# Patient Record
Sex: Male | Born: 1970 | Race: White | Hispanic: No | Marital: Single | Smoking: Never smoker
Health system: Southern US, Community
[De-identification: ages and names within clinical notes are randomized; demographics above are authoritative.]

## PROBLEM LIST (undated history)

## (undated) DIAGNOSIS — I10 Essential (primary) hypertension: Secondary | ICD-10-CM

## (undated) DIAGNOSIS — F1010 Alcohol abuse, uncomplicated: Secondary | ICD-10-CM

## (undated) HISTORY — DX: Essential (primary) hypertension: I10

---

## 2016-03-11 ENCOUNTER — Encounter: Payer: Self-pay | Admitting: *Deleted

## 2016-03-12 ENCOUNTER — Encounter: Payer: Self-pay | Admitting: Neurology

## 2016-03-12 ENCOUNTER — Ambulatory Visit (INDEPENDENT_AMBULATORY_CARE_PROVIDER_SITE_OTHER): Payer: BLUE CROSS/BLUE SHIELD | Admitting: Neurology

## 2016-03-12 VITALS — BP 110/73 | HR 99 | Ht 69.0 in | Wt 226.4 lb

## 2016-03-12 DIAGNOSIS — I10 Essential (primary) hypertension: Secondary | ICD-10-CM | POA: Diagnosis not present

## 2016-03-12 DIAGNOSIS — G378 Other specified demyelinating diseases of central nervous system: Secondary | ICD-10-CM | POA: Diagnosis not present

## 2016-03-12 DIAGNOSIS — G610 Guillain-Barre syndrome: Secondary | ICD-10-CM | POA: Insufficient documentation

## 2016-03-12 MED ORDER — GABAPENTIN 300 MG PO CAPS: 300.0000 mg | ORAL_CAPSULE | Freq: Three times a day (TID) | ORAL | 2 refills | Status: AC

## 2016-03-12 NOTE — Progress Notes (Signed)
NEUROLOGY CLINIC NEW PATIENT NOTE  NAME: Ryan [name] DOB: 09/02/1970 REFERRING PHYSICIAN: Chinita GreenlandKrome, Jonathan, MD  I saw Ryan FontJoseph Kilman as a new consult in the neurovascular clinic today regarding  Chief Complaint  Patient presents with  . Weakness of BLE    rm 2, New Pt, "weaknees of upper legs x 1 month; hands numb, painful"  .  HPI: Ryan FontJoseph Lefevre is a 45 y.o. male with PMH of HTN who presents as a new patient for Bilateral hands in the leg weakness..   [place] moved [place] to [place] in 09/2015. From 11/2015, patient started to have nausea and vomiting, mild stomach pain. Symptoms getting worse over time and eventually not able to get anything down with dehydration, went to ED on 02/16/2016, had EGD done showing gastritis. He was sent home. His gastritis symptoms gradually getting better, currently on Prilosec and he was able to eat and drink, symptoms improved.   However, on 02/19/2016, he felt his leg became stiff, not walking very well, but he did not go to hospital. At the time bilateral upper extremities were normal. Second day, on 02/20/2016, he woke up with bilateral leg weakness, not able to pick up legs, really difficult walking. He felt that he still has strength on his knee and his feet, but not strong on his quads. He went to PCP and concering for GBS and sent to ER again. However, he was told it was not GBS and sent home. The symptoms continues until now. Over time he also developed bilateral thigh pain. Denies much numbness or tingling at the lower extremities.  One week after the leg weakness, around 02/25/16, he also developed bilateral hand weakness, but still able to grasp stuff but not able to write. However, most prominent was the painful sensation and numbness tingling of both hands. Symptoms continued until now. He denies any speech difficulty, swallowing difficulty, double vision, eye lid droop or loss of consciousness.  He has history of hypertension, on  lisinopril. Today blood pressure in clinic 110/73. He denies any history of diabetes, hyperlipidemia. He denies smoking or illicit drugs. However, he is a heavy drinker about 6-8 pack of beer per day for about 3 years.   Past Medical History:  Diagnosis Date  . Hypertension    History reviewed. No pertinent surgical history. Family History  Problem Relation Age of Onset  . Heart disease Mother   . Heart disease Father   . Hypertension Brother    Current Outpatient Prescriptions  Medication Sig Dispense Refill  . ALPRAZolam (XANAX) 0.5 MG tablet Take 0.5 mg by mouth 3 (three) times daily.    Marland Kitchen. gabapentin (NEURONTIN) 300 MG capsule Take 1 capsule (300 mg total) by mouth 3 (three) times daily. 90 capsule 2  . lisinopril (PRINIVIL,ZESTRIL) 40 MG tablet Take 40 mg by mouth daily.    Marland Kitchen. omeprazole (PRILOSEC) 40 MG capsule Take 40 mg by mouth daily.    . traMADol (ULTRAM) 50 MG tablet Take by mouth every 6 (six) hours as needed. Take 1-2 every 4-6 hrs     No current facility-administered medications for this visit.    No Known Allergies Social History   Social History  . Marital status: Single    Spouse name: N/A  . Number of children: N/A  . Years of education: N/A   Occupational History  .      [employer]   Social History Main Topics  . Smoking status: Never Smoker  . Smokeless tobacco: Never  Used  . Alcohol use Yes     Comment: 03/12/16 moderate- 6 pk beer daily  . Drug use: No  . Sexual activity: Not on file   Other Topics Concern  . Not on file   Social History Narrative  . No narrative on file    Review of Systems Full 14 system review of systems performed and notable only for those listed, all others are neg:  Constitutional:   Cardiovascular:  Ear/Nose/Throat:   Skin:  Eyes:   Respiratory:   Gastroitestinal:   Genitourinary:  Hematology/Lymphatic:   Endocrine: Increased thirst Musculoskeletal:  Aching muscles Allergy/Immunology:   Neurological:  Numbness,  weakness Psychiatric:  Sleep:    Physical Exam  Vitals:   03/12/16 1101  BP: 110/73  Pulse: 99    General - Well nourished, well developed, in no apparent distress.  Ophthalmologic - Sharp disc margins OU.  Cardiovascular - Regular rate and rhythm with no murmur. Carotid pulses were 2+ without bruits.   Neck - supple, no nuchal rigidity  Mental Status -  Level of arousal and orientation to time, place, and person were intact. Language including expression, naming, repetition, comprehension, reading, and writing was assessed and found intact. Fund of Knowledge was assessed and was intact.  Cranial Nerves II - XII - II - Visual field intact OU. III, IV, VI - Extraocular movements intact. V - Facial sensation intact bilaterally. VII - Facial movement intact bilaterally. VIII - Hearing & vestibular intact bilaterally. X - Palate elevates symmetrically. XI - Chin turning & shoulder shrug intact bilaterally. XII - Tongue protrusion intact.  Motor Strength - The patient's strength exam showed bilateral UE proximal 5/5, wrist extension and flexion 4/5, bilateral finger gripping 3/5, bilateral LE iliopsoas 3/5, knee extension 4/5, DF 4/5 and PF 5/5.  Bulk was normal and fasciculations were absent.   Motor Tone - Muscle tone was assessed at the neck and appendages and was normal to decreased.  Reflexes - The patient's reflexes were 2+ bilateral biceps, 1+ bilateral triceps, diminished bilateral brachioradialis reflex and diminished LE DTRs and he had no pathological reflexes.  Sensory - Light touch, temperature/pinprick and vibration decreased bilateral hands from wrist down and b/l LE from knee down. Proprioception preserved bilaterally. Painful sensation at b/l hands, L>R and bilateral thighs. Romberg testing were not tested due to safety concerns.    Coordination - The patient had normal movements in the hands with no ataxia or dysmetria.  Tremor was absent.  Gait and Station -  difficulty with walking due to proximal weakness, unsteady gait, needs wheelchair for transportation.   Imaging none  Lab Review none    Assessment and Plan:   In summary, Keghan Mcfarren is a 45 y.o. male with PMH of HTN presents with bilateral hand and leg weakness for 3 weeks, acute onset, non-progressive. Prior to this, pt had 3 months of N/V and diagnosed with gastritis which has improved. The weakness more distal at UEs, but proximal at LEs, associated with numbness, pain, and decreased vibration with preserved proprioception. Reflex decreased at distal UEs and entire LEs. No pathological reflexes. 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 ALS, MMN or myositis not likely. Pt heavy drinker but course not consistent with alcohol neuropathy. Pt is on gabapentin, will increase the dose. Will need PT/OT and EMG/NCS. Will refer to Dr. Terrace Arabia for second opinion and decide further blood tests including heavy metal screen, TSH, anti GM1 or anti GQ1b.   - increase  gabapentin to 300mg  three times a day for neuropathic pain - still can use tramadol as needed for pain - will do EMG/NCS  - will refer to PT/OT in [place] - recommended walk with walker and avoid fall - follow up in 4 weeks with either Dr. Terrace Arabia for second opinion  Thank you very much for the opportunity to participate in the care of this patient.  Please do not hesitate to call if any questions or concerns arise.  Orders Placed This Encounter  Procedures  . Ambulatory referral to Physical Therapy    Referral Priority:   Routine    Referral Type:   Physical Medicine    Referral Reason:   Specialty Services Required    Requested Specialty:   Physical Therapy    Number of Visits Requested:   1  . Ambulatory referral to Occupational Therapy    Referral Priority:   Routine    Referral Type:   Occupational Therapy    Referral Reason:   Specialty Services Required    Requested Specialty:   Occupational Therapy     Number of Visits Requested:   1  . NCV with EMG(electromyography)    Standing Status:   Future    Standing Expiration Date:   03/12/2017    Meds ordered this encounter  Medications  . DISCONTD: gabapentin (NEURONTIN) 100 MG capsule    Sig: Take 100 mg by mouth 3 (three) times daily.    Refill:  0  . lisinopril (PRINIVIL,ZESTRIL) 40 MG tablet    Sig: Take 40 mg by mouth daily.  . traMADol (ULTRAM) 50 MG tablet    Sig: Take by mouth every 6 (six) hours as needed. Take 1-2 every 4-6 hrs  . ALPRAZolam (XANAX) 0.5 MG tablet    Sig: Take 0.5 mg by mouth 3 (three) times daily.  Marland Kitchen omeprazole (PRILOSEC) 40 MG capsule    Sig: Take 40 mg by mouth daily.  Marland Kitchen gabapentin (NEURONTIN) 300 MG capsule    Sig: Take 1 capsule (300 mg total) by mouth 3 (three) times daily.    Dispense:  90 capsule    Refill:  2    Patient Instructions  - increase gabapentin to 300mg  three times a day for neuropathic pain - still can use tramadol as needed for pain - will do EMG/NCS  - will refer to PT/OT in [place] - recommended walk with walker and avoid fall - follow up in 4 weeks with either Dr. Terrace Arabia or Dr. Lenard Galloway, MD PhD [facility] [address] [place], [place] [address] [phone]

## 2016-03-12 NOTE — Patient Instructions (Addendum)
-   increase gabapentin to 300mg  three times a day for neuropathic pain - still can use tramadol as needed for pain - will do EMG/NCS  - will refer to PT/OT in [place] - recommended walk with walker and avoid fall - follow up in 4 weeks with either Dr. Terrace ArabiaYan for second opinion

## 2016-03-15 ENCOUNTER — Telehealth: Payer: Self-pay | Admitting: Neurology

## 2016-03-15 ENCOUNTER — Telehealth: Payer: Self-pay | Admitting: *Deleted

## 2016-03-15 NOTE — Telephone Encounter (Signed)
-----   Message from Marvel PlanJindong Xu, MD sent at 03/12/2016 12:32 PM EDT ----- Regarding: RE: Needs FU I wrote in the note and I would like him to see either Dr. Lucia GaskinsAhern or Dr. Terrace ArabiaYan in 4 weeks whoever has slot. Thanks  Marvel PlanJindong Xu, MD PhD Stroke Neurology 03/12/2016 12:33 PM  ----- Message ----- From: [place] PinkMary C Andreina Outten, RN Sent: 03/12/2016  12:24 PM To: Marvel PlanJindong Xu, MD, Hildred AlaminKatrina Y Murrell, RN Subject: Needs FU                                       Katrina,  I took this patient to check out today. Dr Roda ShuttersXu wanted to see him in 4 weeks  following EMG/NCS on 04/01/16.  Dr Roda ShuttersXu had no availability until mid-late Nov. So Albin FellingCarla did not schedule his follow up.  Please call the patient when you can determine his follow up.  Thank you, Ambrose PancoastMary Clare

## 2016-03-15 NOTE — Telephone Encounter (Signed)
His NCV/EMG and follow up have been rescheduled w/ Dr. Terrace ArabiaYan.  Patient aware of times.

## 2016-03-15 NOTE — Telephone Encounter (Addendum)
Left message for a return call.  He needs to be schedule with Dr. Terrace ArabiaYan in four weeks.

## 2016-03-15 NOTE — Telephone Encounter (Signed)
Sandy with [facility] is calling about a referral you sent for the patient. Andrey CampanileSandy states they only do PT not OT. Please call if there are questions.

## 2016-03-15 NOTE — Telephone Encounter (Signed)
Routed to ClayMichelle, [place] for Dr Terrace ArabiaYan. She stated she will call patient and schedule his follow up.

## 2016-03-16 NOTE — Telephone Encounter (Signed)
Sent Records to [facility] in [place] . They  handle OT and PT.  Telephone [phone]. Fax [phone].

## 2016-04-01 ENCOUNTER — Ambulatory Visit (INDEPENDENT_AMBULATORY_CARE_PROVIDER_SITE_OTHER): Payer: BLUE CROSS/BLUE SHIELD | Admitting: Neurology

## 2016-04-01 ENCOUNTER — Other Ambulatory Visit: Payer: Self-pay | Admitting: *Deleted

## 2016-04-01 DIAGNOSIS — R269 Unspecified abnormalities of gait and mobility: Secondary | ICD-10-CM | POA: Diagnosis not present

## 2016-04-01 DIAGNOSIS — R531 Weakness: Secondary | ICD-10-CM

## 2016-04-01 DIAGNOSIS — G378 Other specified demyelinating diseases of central nervous system: Secondary | ICD-10-CM

## 2016-04-01 DIAGNOSIS — G610 Guillain-Barre syndrome: Secondary | ICD-10-CM

## 2016-04-01 DIAGNOSIS — R202 Paresthesia of skin: Secondary | ICD-10-CM

## 2016-04-01 MED ORDER — TRAMADOL HCL 50 MG PO TABS: 50.0000 mg | ORAL_TABLET | Freq: Four times a day (QID) | ORAL | 0 refills | Status: AC | PRN

## 2016-04-01 NOTE — Progress Notes (Signed)
PATIENT: Ryan [name] DOB: 21-Jan-1971  HISTORICAL  Athan Casalino is a 45 year old right-handed male, seen in refer by my collegue Dr. Erlinda Hong for evaluation of subacute onset progressive bilateral lower extremity, upper extremity sensory loss and weakness. Initial evaluation was April 01 2016, I met him during electrodiagnostic study.  Reviewing Dr.Xu's note, patient recently moved from [place] to [place] in April 2017, since June 2017, he suffered significant upper GI symptoms, nausea or vomiting could not even hold liquids down, EGD in September showed gastritis, his GI symptoms gradually resolved.  But since middle of September 2017, while his GI symptoms began to improve, he noticed subacute onset bilateral lower extremity stiffness, woke up second day February 20 2016 noticed bilateral lower extremity weakness, but he denies significant lower extremity paresthesia.  A week later,he also noticed bilateral hands, upper extremity weakness, painful numbness of both hands,  Over the past one month, he has to rely on cane, had developed significant gait abnormality, lost use of both hands, difficulty buttoning, tie his shoes, over the past few days, he seems to have mild improvement, but remained symptomatic.  He denies significant low back pain neck pain, no bowel and bladder incontinence, no bulbar symptoms.  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  I also reviewed medical records he brought from previous evaluation, he had 2 years history of left hip pain, was treated by orthopedic surgeon, x-ray of left hip in January 2016 showed evidence of tendinopathy, tearing at hamstring tendons. He continue has mild left hip pain, he also has past medical history of hypertension,  He is here for electrodiagnostic study today, which showed absent sensory  responses on bilateral upper and lower extremity muscles, motor responses showed severely decreased to C map amplitude at bilateral peroneal to EDB motor responses, with slow conduction velocity in 30 m/s, absent F-wave latency. Bilateral tibial motor responses showed upper limit of distal latency, with normal C map amplitude, slow conduction velocity at 30 m/s, with prolonged F wave latency. Bilateral median motor responses showed moderately prolonged distal latency, with normal range C map amplitude, slow conduction velocity 40 m/s, prolonged F-wave latency. Bilateral ulnar motor responses also showed normal distal latency, normal C map amplitude, low normal range conduction velocity.  Electromyography showed widespread moderate to severe spontaneous activity, simple morphology motor unit potential with mildly decreased recruitment patterns.  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  He has tried gabapentin 300 mg 3 times a day without helping his neuropathic pain, add on tramadol 50 mg as needed   REVIEW OF SYSTEMS: Full 14 system review of systems performed and notable only for as above  ALLERGIES: No Known Allergies  HOME MEDICATIONS: Current Outpatient Prescriptions  Medication Sig Dispense Refill  . ALPRAZolam (XANAX) 0.5 MG tablet Take 0.5 mg by mouth 3 (three) times daily.    Marland Kitchen gabapentin (NEURONTIN) 300 MG capsule Take 1 capsule (300 mg total) by mouth 3 (  three)  times daily. 90 capsule 2  . lisinopril (PRINIVIL,ZESTRIL) 40 MG tablet Take 40 mg by mouth daily.    Marland Kitchen omeprazole (PRILOSEC) 40 MG capsule Take 40 mg by mouth daily.    . traMADol (ULTRAM) 50 MG tablet Take by mouth every 6 (six) hours as needed. Take 1-2 every 4-6 hrs     No current facility-administered medications for this visit.     PAST MEDICAL HISTORY: Past Medical History:  Diagnosis Date  . Hypertension     PAST SURGICAL HISTORY: No past surgical history on file.  FAMILY HISTORY: Family History  Problem Relation Age of Onset  . Heart disease Mother   . Heart disease Father   . Hypertension Brother     SOCIAL HISTORY:  Social History   Social History  . Marital status: Single    Spouse name: N/A  . Number of children: N/A  . Years of education: N/A   Occupational History  .      [employer]   Social History Main Topics  . Smoking status: Never Smoker  . Smokeless tobacco: Never Used  . Alcohol use Yes     Comment: 03/12/16 moderate- 6 pk beer daily  . Drug use: No  . Sexual activity: Not on file   Other Topics Concern  . Not on file   Social History Narrative  . No narrative on file     PHYSICAL EXAM   There were no vitals filed for this visit.  Not recorded      There is no height or weight on file to calculate BMI.  PHYSICAL EXAMNIATION:  Gen: NAD, conversant, well nourised, obese, well groomed                     Cardiovascular: Regular rate rhythm, no peripheral edema, warm, nontender. Eyes: Conjunctivae clear without exudates or hemorrhage Neck: Supple, no carotid bruits. Pulmonary: Clear to auscultation bilaterally   NEUROLOGICAL EXAM:  MENTAL STATUS: Speech:    Speech is normal; fluent and spontaneous with normal comprehension.  Cognition:     Orientation to time, place and person     Normal recent and remote memory     Normal Attention span and concentration     Normal Language, naming, repeating,spontaneous speech     Fund of  knowledge   CRANIAL NERVES: CN II: Visual fields are full to confrontation. Fundoscopic exam is normal with sharp discs and no vascular changes. Pupils are round equal and briskly reactive to light. CN III, IV, VI: extraocular movement are normal. No ptosis. CN V: Facial sensation is intact to pinprick in all 3 divisions bilaterally. Corneal responses are intact.  CN VII: Face is symmetric with normal eye closure and smile. CN VIII: Hearing is normal to rubbing fingers CN IX, X: Palate elevates symmetrically. Phonation is normal. CN XI: Head turning and shoulder shrug are intact CN XII: Tongue is midline with normal movements and no atrophy.  MOTOR: Bilateral upper extremity proximal muscle strength is normal, moderate weakness at bilateral wrist flexion extension grade, which is also limited by his neuropathic pain, he has mild to moderate bilateral lower extremity proximal and distal muscle weakness.  REFLEXES: Reflexes are 2+ and symmetric at the biceps, triceps, absent at knees, and ankles. Plantar responses are flexor.  SENSORY: Decreased light touch pinprick bilateral lower extremity to knee level, at bilateral upper extremity to wrist level,  COORDINATION: Rapid alternating movements and fine finger movements are intact. There is no  dysmetria on finger-to-nose and heel-knee-shin.    GAIT/STANCE: Need assistant to get out from seated position, lordotic gait, unsteady,  DIAGNOSTIC DATA (LABS, IMAGING, TESTING) - I reviewed patient records, labs, notes, testing and imaging myself where available.   ASSESSMENT AND PLAN  Vasco Chong is a 45 y.o. 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 Ph.D.  [facility] [address] [place], [place] [address] Ph: [phone] Fax: [phone]  CC: Referring Provider

## 2016-04-02 NOTE — Procedures (Addendum)
NCS (NERVE CONDUCTION STUDY) WITH EMG (ELECTROMYOGRAPHY) REPORT   STUDY DATE: April 01 2016 PATIENT NAME: Ryan [name] DOB: [birth year] MRN: [id number]    TECHNOLOGIST: Charlesetta Ivory ELECTROMYOGRAPHER: Levert Feinstein M.D.  CLINICAL INFORMATION:   45 year old male presenting with subacute onset bilateral lower extremity and then progressive to paresthesia or weakness, significant gait abnormality.  FINDINGS: NERVE CONDUCTION STUDY: Bilateral sural, superficial peroneal, medial, ulnar, right radial sensory responses were absent. Only left radial sensory was present with mildly prolonged peak latency, and decreased snap amplitude.  Bilateral peroneal to EDB motor responses showed severely decreased to C map amplitude, conduction velocity was in the 30 m/second range. F wave latency was absent.   Bilateral tibial motor responses showed upper limit normal, to mildly prolonged distal latency, low normal range C map amplitude, prolonged F wave latency, conduction velocity was in the 30 m/s range.   Bilateral median motor responses showed moderately prolonged distal latency, low normal range C map amplitude, decreased conduction velocity 40 m/s range, with prolonged F wave latency 34-35 ms.   Bilateral ulnar motor response is social showed normal distal latency, C map amplitude, mildly decreased conduction velocity 45 m/s.  NEEDLE ELECTROMYOGRAPHY: Selective needle examinations were performed at bilateral lower extremity muscles, left upper extremity muscles, bilateral lumbar sacral paraspinal muscles, and left cervical paraspinal muscles.   Bilateral tibialis anterior, tibialis posterior, medial gastrocnemius: Increased insertional activity, 2-3 plus spontaneous activity, enlarged complex motor unit potential with decreased recruitment patterns.  Bilateral vastus lateralis: Increased insertional activityspontaneous activity, enlarged complex motor unit potential with mildly decreased  recruitment patterns.  Left first dorsal interossei: Increased insertional activity 1 plus spontaneous activity, enlarge complex motor unit potential with decreased recruitment patterns.  Left pronator teres, extensor digitorum communis, biceps triceps, deltoid, no more insertion activity no spontaneous activity, mildly enlarged complex motor unit potential was mildly decreased recruitment patterns.  There was no spontaneous activity at bilateral lumbar sacral paraspinal muscles, bilateral L4, 5 S1.  There was no spontaneous activity at left cervical paraspinal muscles, left C5-6 and 7.   IMPRESSION:   This is an abnormal study. There is electrodiagnostic evidence of widespread active neuropathic changes involving bilateral lumbar sacral myotomes, and milder degree left cervical myotomes. There was evidence of prolonged distal latency, F wave latency, mildly slow conduction velocity of multiple motor nerves tested, there is no evidence of temporal dispersion.   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  Extensive evaluation including imaging study, CSF study, laboratory evaluation has initiated.   SNC    Nerve / Sites Rec. Site Peak Lat Ref. Amp.1-2 Ref. Distance    ms ms V V cm  R Radial - Anatomical snuff box (Forearm)     Forearm Wrist NR ?2.90 NR ?15.0 10  L Radial - Anatomical snuff box (Forearm)     Forearm Wrist 3.13 ?2.90 6.1 ?15.0 10  L Sural - Ankle (Calf)     Calf Ankle NR ?4.40 NR ?6.0 14  R Sural - Ankle (Calf)     Calf Ankle NR ?4.40 NR ?6.0 14  L Superficial peroneal - Ankle     Lat leg Ankle NR ?4.40 NR ?6.0 14  R Superficial peroneal - Ankle     Lat leg Ankle NR ?4.40 NR ?6.0 14  R Median - Orthodromic (Dig II, Mid palm)     Dig II Wrist NR ?3.40 NR ?10.0 13  L Median - Orthodromic (Dig II, Mid palm)  Dig II Wrist NR ?3.40 NR ?10.0 13  R Ulnar - Orthodromic, (Dig V,  Mid palm)     Dig V Wrist 3.18 ?3.10 3.2 ?5.0 11  L Ulnar - Orthodromic, (Dig V, Mid palm)     Dig V Wrist NR ?3.10 NR ?5.0 11     MNC    Nerve / Sites Muscle Latency Ref. Amplitude Ref. Rel Amp Segments Distance Lat Diff Velocity Ref. Area    ms ms mV mV %  cm ms m/s m/s mVms  R Median - APB     Wrist APB 4.8 ?4.4 3.8 ?4.0 100 Wrist - APB 7    16.0     Upper arm APB 11.8  2.6  69.3 Upper arm - Wrist 28 6.9 40  11.5  L Median - APB     Wrist APB 4.9 ?4.4 5.2 ?4.0 100 Wrist - APB 7    19.8     Upper arm APB 11.5  3.5  66.6 Upper arm - Wrist 25 6.5 38  14.3  R Ulnar - ADM     Wrist ADM 3.2 ?3.3 8.7 ?6.0 100 Wrist - ADM 7    33.6     B.Elbow ADM 7.3  7.9  91.5 B.Elbow - Wrist 19 4.2 46 ?49 33.7     A.Elbow ADM 9.3  7.6  96.2 A.Elbow - B.Elbow 9 2.0 45 ?49 33.6         A.Elbow - Wrist  6.1     L Ulnar - ADM     Wrist ADM 2.9 ?3.3 6.1 ?6.0 100 Wrist - ADM 7    26.8     B.Elbow ADM 7.2  5.9  96.7 B.Elbow - Wrist 19 4.3 44 ?49 26.1     A.Elbow ADM 9.4  5.7  96.7 A.Elbow - B.Elbow 10 2.2 46 ?49 25.6         A.Elbow - Wrist  6.5     R Peroneal - EDB     Ankle EDB 6.0 ?6.5 0.8 ?2.0 100 Ankle - EDB 9    2.5     Fib head EDB 15.8  0.7  88.8 Fib head - Ankle 31 9.8 32 ?44 2.8     Pop fossa EDB 18.9  0.7  94.9 Pop fossa - Fib head 9 3.0 30 ?44 4.4         Pop fossa - Ankle  12.8     L Peroneal - EDB     Ankle EDB 6.0 ?6.5 0.5 ?2.0 100 Ankle - EDB 9    1.6     Fib head EDB 15.8  0.5  89.6 Fib head - Ankle 29 9.8 29 ?44 1.6     Pop fossa EDB 18.5  1.1  217 Pop fossa - Fib head 8 2.7 30 ?44 2.4         Pop fossa - Ankle  12.6     R Tibial - AH     Ankle AH 5.4 ?5.8 3.9 ?4.0 100 Ankle - AH 9    13.1     Pop fossa AH 18.6  2.4  61.7 Pop fossa - Ankle 38 13.3 29 ?41 9.8  L Tibial - AH     Ankle AH 5.8 ?5.8 4.4 ?4.0 100 Ankle - AH 9    13.2     Pop fossa AH 18.6     Pop fossa - Ankle 36 12.9 28 ?41      F  Wave    Nerve F Lat Ref.   ms ms  R Tibial - AH 58.8 ?56.0  L Tibial - AH 64.8 ?56.0    R Median - APB 34.1 ?31.0  L Median - APB 34.9 ?31.0     EMG full       EMG       EMG Summary Table    Spontaneous MUAP Recruitment  Muscle IA Fib PSW Fasc Other Amp Dur. Poly Pattern  R. Tibialis anterior Increased 3+ None None _______ Increased Increased 1+ Reduced  R. Peroneus longus Increased 2+ None None _______ Increased Increased 1+ Reduced  R. Vastus lateralis Increased None None None _______ Increased Increased 1+ Reduced  R. Lumbar paraspinals (low) Normal None None None _______ Normal Normal Normal Normal  R. Lumbar paraspinals (mid) Normal None None None _______ Normal Normal Normal Normal  L. Tibialis anterior Increased 2+ None None _______ Increased Increased 1+ Reduced  L. Gastrocnemius (Medial head) Increased 2+ None None _______ Increased Increased 1+ Reduced  L. Vastus lateralis Normal None None None _______ Increased Increased 1+ Reduced  L. Lumbar paraspinals (mid) Normal None None None _______ Normal Normal Normal Normal  L. Lumbar paraspinals (low) Normal None None None _______ Normal Normal Normal Normal  L. Deltoid Normal None None None _______ Increased Increased 1+ Reduced  L. Biceps brachii Normal None None None _______ Increased Increased 1+ Reduced  L. Extensor digitorum communis Normal None None None _______ Normal Normal Normal Normal  L. Cervical paraspinals Normal None None None _______ Normal Normal Normal Normal                                                      INTERPRETING PHYSICIAN:   Levert FeinsteinYan, Denea Cheaney M.D. Ph.D. [facility] [address], [address] [place], [place] [address] [phone]

## 2016-04-02 NOTE — Addendum Note (Signed)
Addended by: Levert FeinsteinYAN, Dorenda Pfannenstiel on: 04/02/2016 09:05 AM   Modules accepted: Orders

## 2016-04-05 ENCOUNTER — Ambulatory Visit (INDEPENDENT_AMBULATORY_CARE_PROVIDER_SITE_OTHER): Payer: Self-pay

## 2016-04-05 ENCOUNTER — Telehealth: Payer: Self-pay | Admitting: Neurology

## 2016-04-05 ENCOUNTER — Inpatient Hospital Stay
Admission: RE | Admit: 2016-04-05 | Discharge: 2016-04-05 | Disposition: A | Discharge status: Home / Self Care | Payer: Self-pay | Source: Ambulatory Visit | Attending: Neurology | Admitting: Neurology

## 2016-04-05 DIAGNOSIS — R531 Weakness: Secondary | ICD-10-CM

## 2016-04-05 DIAGNOSIS — G610 Guillain-Barre syndrome: Secondary | ICD-10-CM

## 2016-04-05 DIAGNOSIS — R269 Unspecified abnormalities of gait and mobility: Secondary | ICD-10-CM

## 2016-04-05 DIAGNOSIS — Z0289 Encounter for other administrative examinations: Secondary | ICD-10-CM

## 2016-04-05 LAB — THYROID PANEL WITH TSH
FREE THYROXINE INDEX: 3.1 (ref 1.2–4.9)
T3 UPTAKE RATIO: 29 % (ref 24–39)
T4, Total: 10.7 ug/dL (ref 4.5–12.0)
TSH: 3.81 u[IU]/mL (ref 0.450–4.500)

## 2016-04-05 LAB — HEPATITIS PANEL, ACUTE
HEP A IGM: NEGATIVE
HEP B C IGM: NEGATIVE
HEP B S AG: NEGATIVE
Hep C Virus Ab: <0.1 s/co ratio (ref 0.0–0.9)

## 2016-04-05 LAB — CBC WITH DIFFERENTIAL/PLATELET
BASOS ABS: 0 10*3/uL (ref 0.0–0.2)
Basos: 0 %
EOS (ABSOLUTE): 0.1 10*3/uL (ref 0.0–0.4)
Eos: 1 %
Hematocrit: 30 % — ABNORMAL LOW (ref 37.5–51.0)
Hemoglobin: 10.9 g/dL — ABNORMAL LOW (ref 12.6–17.7)
Immature Grans (Abs): 0 10*3/uL (ref 0.0–0.1)
Immature Granulocytes: 0 %
LYMPHS ABS: 1.4 10*3/uL (ref 0.7–3.1)
Lymphs: 17 %
MCH: 34.5 pg — ABNORMAL HIGH (ref 26.6–33.0)
MCHC: 36.3 g/dL — AB (ref 31.5–35.7)
MCV: 95 fL (ref 79–97)
MONOS ABS: 1 10*3/uL — AB (ref 0.1–0.9)
Monocytes: 12 %
Neutrophils Absolute: 5.8 10*3/uL (ref 1.4–7.0)
Neutrophils: 70 %
Platelets: 207 10*3/uL (ref 150–379)
RBC: 3.16 x10E6/uL — AB (ref 4.14–5.80)
RDW: 15.8 % — AB (ref 12.3–15.4)
WBC: 8.4 10*3/uL (ref 3.4–10.8)

## 2016-04-05 LAB — B. BURGDORFI ANTIBODIES

## 2016-04-05 LAB — MULTIPLE MYELOMA PANEL, SERUM
ALBUMIN/GLOB SERPL: 1.2 (ref 0.7–1.7)
ALPHA2 GLOB SERPL ELPH-MCNC: 0.5 g/dL (ref 0.4–1.0)
Albumin SerPl Elph-Mcnc: 3.3 g/dL (ref 2.9–4.4)
Alpha 1: 0.3 g/dL (ref 0.0–0.4)
B-GLOBULIN SERPL ELPH-MCNC: 0.9 g/dL (ref 0.7–1.3)
GLOBULIN, TOTAL: 2.8 g/dL (ref 2.2–3.9)
Gamma Glob SerPl Elph-Mcnc: 1.2 g/dL (ref 0.4–1.8)
IGA/IMMUNOGLOBULIN A, SERUM: 254 mg/dL (ref 90–386)
IgG (Immunoglobin G), Serum: 1018 mg/dL (ref 700–1600)
IgM (Immunoglobulin M), Srm: 138 mg/dL (ref 20–172)

## 2016-04-05 LAB — COMPREHENSIVE METABOLIC PANEL
ALK PHOS: 106 IU/L (ref 39–117)
ALT: 75 IU/L — AB (ref 0–44)
AST: 176 IU/L — ABNORMAL HIGH (ref 0–40)
Albumin/Globulin Ratio: 1.5 (ref 1.2–2.2)
Albumin: 3.7 g/dL (ref 3.5–5.5)
BILIRUBIN TOTAL: 4.3 mg/dL — AB (ref 0.0–1.2)
BUN / CREAT RATIO: 9 (ref 9–20)
BUN: 7 mg/dL (ref 6–24)
CHLORIDE: 78 mmol/L — AB (ref 96–106)
CO2: 29 mmol/L (ref 18–29)
Calcium: 8.9 mg/dL (ref 8.7–10.2)
Creatinine, Ser: 0.82 mg/dL (ref 0.76–1.27)
GFR calc Af Amer: 123 mL/min/{1.73_m2} (ref >59)
GFR calc non Af Amer: 106 mL/min/{1.73_m2} (ref >59)
GLUCOSE: 127 mg/dL — AB (ref 65–99)
Globulin, Total: 2.4 g/dL (ref 1.5–4.5)
Potassium: 4 mmol/L (ref 3.5–5.2)
Sodium: 123 mmol/L — ABNORMAL LOW (ref 134–144)
Total Protein: 6.1 g/dL (ref 6.0–8.5)

## 2016-04-05 LAB — FOLATE: Folate: <2 ng/mL — ABNORMAL LOW (ref >3.0)

## 2016-04-05 LAB — FERRITIN: FERRITIN: 2860 ng/mL — AB (ref 30–400)

## 2016-04-05 LAB — IRON AND TIBC
Iron: 159 ug/dL (ref 38–169)
Total Iron Binding Capacity: <176 ug/dL — ABNORMAL LOW (ref 250–450)
UIBC: <17 ug/dL — ABNORMAL LOW (ref 111–343)

## 2016-04-05 LAB — CK: CK TOTAL: 68 U/L (ref 24–204)

## 2016-04-05 LAB — ANA W/REFLEX IF POSITIVE: Anti Nuclear Antibody(ANA): NEGATIVE

## 2016-04-05 LAB — VITAMIN D 25 HYDROXY (VIT D DEFICIENCY, FRACTURES): VIT D 25 HYDROXY: 12.1 ng/mL — AB (ref 30.0–100.0)

## 2016-04-05 LAB — COPPER, SERUM: Copper: 93 ug/dL (ref 72–166)

## 2016-04-05 LAB — ANGIOTENSIN CONVERTING ENZYME

## 2016-04-05 LAB — C-REACTIVE PROTEIN: CRP: 9.1 mg/L — ABNORMAL HIGH (ref 0.0–4.9)

## 2016-04-05 LAB — HIV ANTIBODY (ROUTINE TESTING W REFLEX): HIV Screen 4th Generation wRfx: NONREACTIVE

## 2016-04-05 LAB — VITAMIN B12: VITAMIN B 12: 1399 pg/mL — AB (ref 211–946)

## 2016-04-05 LAB — RPR: RPR: NONREACTIVE

## 2016-04-05 NOTE — Telephone Encounter (Signed)
Thank you for taking care of this pt.   Ryan [name]

## 2016-04-05 NOTE — Telephone Encounter (Signed)
I have called patient, he just finished only part of ordered MRIs, I have no access to the film, but not all the MRIs ordered, he could not hold still.  He also canceled lumbar puncture as previously ordered, was to have MRI first, this was rescheduled for April 08 2016,  He does not want to come back for follow-up as previously scheduled on April 07 2016, Elon JesterMichele, please go ahead and cancel his appointment, will follow-up on his MRIs and the CSF result, he will also call our clinic for reschedule once workup is done.

## 2016-04-05 NOTE — Telephone Encounter (Signed)
I called him for abnormal laboratory evaluation,  Sodium was 123, elevated ALT AST, he did reported a history of alcohol abuse, he will have appointment with a local nephrologist, laboratory evaluation also showed low vitamin D 12.1, he should take over-the-counter vitamin D supplement,  Significantly elevated ferritin level 2860, low Hg 10.9. Indicating a inflammatory process  He is advised to expedite the workup including spinal fluid testing to help forming a treatment plan.  He wants to take his time to finish MRI, lumbar puncture, potential second opinion

## 2016-04-05 NOTE — Discharge Instructions (Signed)

## 2016-04-05 NOTE — Progress Notes (Signed)
I have reviewed and agreed above plan. 

## 2016-04-07 ENCOUNTER — Ambulatory Visit: Payer: Self-pay | Admitting: Neurology

## 2016-04-09 ENCOUNTER — Telehealth: Payer: Self-pay | Admitting: Neurology

## 2016-04-09 NOTE — Telephone Encounter (Signed)
Please call patient MRI of the brain, cervical, thoracic spine showed no significant abnormality, check on his lumbar puncture schedule,  Emphasize with him, he should seek early treatment for better recovery in the long-term.

## 2016-04-12 NOTE — Telephone Encounter (Signed)
Pt called requesting clarification on the test results [phone].  He said he was frustrated and wanted to the "ball rolling".

## 2016-04-12 NOTE — Telephone Encounter (Signed)
Spoke to patient - he is aware of results.  He [facility] and canceled it.  I provided him with their phone number and urged him to call to reschedule the procedure.  He was agreeable and stated he would call.

## 2016-04-13 ENCOUNTER — Ambulatory Visit: Payer: BLUE CROSS/BLUE SHIELD | Admitting: Neurology

## 2016-04-14 ENCOUNTER — Other Ambulatory Visit: Payer: Self-pay | Admitting: *Deleted

## 2016-04-22 ENCOUNTER — Inpatient Hospital Stay (HOSPITAL_COMMUNITY)
Admission: EM | Admit: 2016-04-22 | Discharge: 2016-05-07 | DRG: 094 | Disposition: E | Payer: BLUE CROSS/BLUE SHIELD | Attending: Pulmonary Disease | Admitting: Pulmonary Disease

## 2016-04-22 ENCOUNTER — Emergency Department (HOSPITAL_COMMUNITY): Payer: BLUE CROSS/BLUE SHIELD

## 2016-04-22 ENCOUNTER — Encounter (HOSPITAL_COMMUNITY): Payer: Self-pay | Admitting: Emergency Medicine

## 2016-04-22 DIAGNOSIS — J80 Acute respiratory distress syndrome: Secondary | ICD-10-CM | POA: Diagnosis not present

## 2016-04-22 DIAGNOSIS — D649 Anemia, unspecified: Secondary | ICD-10-CM | POA: Diagnosis present

## 2016-04-22 DIAGNOSIS — E874 Mixed disorder of acid-base balance: Secondary | ICD-10-CM | POA: Diagnosis not present

## 2016-04-22 DIAGNOSIS — R4182 Altered mental status, unspecified: Secondary | ICD-10-CM

## 2016-04-22 DIAGNOSIS — J9601 Acute respiratory failure with hypoxia: Secondary | ICD-10-CM

## 2016-04-22 DIAGNOSIS — G936 Cerebral edema: Secondary | ICD-10-CM | POA: Diagnosis not present

## 2016-04-22 DIAGNOSIS — D589 Hereditary hemolytic anemia, unspecified: Secondary | ICD-10-CM | POA: Diagnosis present

## 2016-04-22 DIAGNOSIS — I330 Acute and subacute infective endocarditis: Secondary | ICD-10-CM | POA: Diagnosis not present

## 2016-04-22 DIAGNOSIS — A4101 Sepsis due to Methicillin susceptible Staphylococcus aureus: Secondary | ICD-10-CM | POA: Diagnosis not present

## 2016-04-22 DIAGNOSIS — I10 Essential (primary) hypertension: Secondary | ICD-10-CM | POA: Diagnosis present

## 2016-04-22 DIAGNOSIS — R109 Unspecified abdominal pain: Secondary | ICD-10-CM

## 2016-04-22 DIAGNOSIS — E538 Deficiency of other specified B group vitamins: Secondary | ICD-10-CM

## 2016-04-22 DIAGNOSIS — G92 Toxic encephalopathy: Secondary | ICD-10-CM | POA: Diagnosis not present

## 2016-04-22 DIAGNOSIS — E872 Acidosis, unspecified: Secondary | ICD-10-CM

## 2016-04-22 DIAGNOSIS — R569 Unspecified convulsions: Secondary | ICD-10-CM | POA: Diagnosis not present

## 2016-04-22 DIAGNOSIS — G610 Guillain-Barre syndrome: Secondary | ICD-10-CM | POA: Diagnosis present

## 2016-04-22 DIAGNOSIS — R531 Weakness: Secondary | ICD-10-CM

## 2016-04-22 DIAGNOSIS — G6289 Other specified polyneuropathies: Secondary | ICD-10-CM

## 2016-04-22 DIAGNOSIS — R579 Shock, unspecified: Secondary | ICD-10-CM

## 2016-04-22 DIAGNOSIS — R402252 Coma scale, best verbal response, oriented, at arrival to emergency department: Secondary | ICD-10-CM | POA: Diagnosis present

## 2016-04-22 DIAGNOSIS — Z8249 Family history of ischemic heart disease and other diseases of the circulatory system: Secondary | ICD-10-CM | POA: Diagnosis not present

## 2016-04-22 DIAGNOSIS — K7200 Acute and subacute hepatic failure without coma: Secondary | ICD-10-CM | POA: Diagnosis not present

## 2016-04-22 DIAGNOSIS — E7220 Disorder of urea cycle metabolism, unspecified: Secondary | ICD-10-CM

## 2016-04-22 DIAGNOSIS — D6489 Other specified anemias: Secondary | ICD-10-CM

## 2016-04-22 DIAGNOSIS — N170 Acute kidney failure with tubular necrosis: Secondary | ICD-10-CM | POA: Diagnosis not present

## 2016-04-22 DIAGNOSIS — K567 Ileus, unspecified: Secondary | ICD-10-CM | POA: Diagnosis not present

## 2016-04-22 DIAGNOSIS — R680 Hypothermia, not associated with low environmental temperature: Secondary | ICD-10-CM | POA: Diagnosis not present

## 2016-04-22 DIAGNOSIS — R410 Disorientation, unspecified: Secondary | ICD-10-CM

## 2016-04-22 DIAGNOSIS — K7290 Hepatic failure, unspecified without coma: Secondary | ICD-10-CM | POA: Diagnosis not present

## 2016-04-22 DIAGNOSIS — D689 Coagulation defect, unspecified: Secondary | ICD-10-CM | POA: Diagnosis not present

## 2016-04-22 DIAGNOSIS — G9340 Encephalopathy, unspecified: Secondary | ICD-10-CM | POA: Diagnosis not present

## 2016-04-22 DIAGNOSIS — R402142 Coma scale, eyes open, spontaneous, at arrival to emergency department: Secondary | ICD-10-CM | POA: Diagnosis present

## 2016-04-22 DIAGNOSIS — G629 Polyneuropathy, unspecified: Secondary | ICD-10-CM | POA: Diagnosis not present

## 2016-04-22 DIAGNOSIS — K7030 Alcoholic cirrhosis of liver without ascites: Secondary | ICD-10-CM | POA: Diagnosis present

## 2016-04-22 DIAGNOSIS — Z452 Encounter for adjustment and management of vascular access device: Secondary | ICD-10-CM

## 2016-04-22 DIAGNOSIS — R6521 Severe sepsis with septic shock: Secondary | ICD-10-CM | POA: Diagnosis not present

## 2016-04-22 DIAGNOSIS — R140 Abdominal distension (gaseous): Secondary | ICD-10-CM

## 2016-04-22 DIAGNOSIS — J9602 Acute respiratory failure with hypercapnia: Secondary | ICD-10-CM

## 2016-04-22 DIAGNOSIS — J690 Pneumonitis due to inhalation of food and vomit: Secondary | ICD-10-CM | POA: Diagnosis not present

## 2016-04-22 DIAGNOSIS — E569 Vitamin deficiency, unspecified: Secondary | ICD-10-CM | POA: Diagnosis present

## 2016-04-22 DIAGNOSIS — E871 Hypo-osmolality and hyponatremia: Secondary | ICD-10-CM | POA: Diagnosis present

## 2016-04-22 DIAGNOSIS — D599 Acquired hemolytic anemia, unspecified: Secondary | ICD-10-CM | POA: Diagnosis not present

## 2016-04-22 DIAGNOSIS — F10239 Alcohol dependence with withdrawal, unspecified: Secondary | ICD-10-CM | POA: Diagnosis present

## 2016-04-22 DIAGNOSIS — R208 Other disturbances of skin sensation: Secondary | ICD-10-CM | POA: Diagnosis not present

## 2016-04-22 DIAGNOSIS — R402362 Coma scale, best motor response, obeys commands, at arrival to emergency department: Secondary | ICD-10-CM | POA: Diagnosis present

## 2016-04-22 DIAGNOSIS — G40901 Epilepsy, unspecified, not intractable, with status epilepticus: Secondary | ICD-10-CM | POA: Diagnosis not present

## 2016-04-22 DIAGNOSIS — R0603 Acute respiratory distress: Secondary | ICD-10-CM

## 2016-04-22 DIAGNOSIS — G608 Other hereditary and idiopathic neuropathies: Secondary | ICD-10-CM | POA: Diagnosis present

## 2016-04-22 DIAGNOSIS — R339 Retention of urine, unspecified: Secondary | ICD-10-CM | POA: Diagnosis not present

## 2016-04-22 DIAGNOSIS — Z789 Other specified health status: Secondary | ICD-10-CM

## 2016-04-22 DIAGNOSIS — R400 Somnolence: Secondary | ICD-10-CM

## 2016-04-22 DIAGNOSIS — R0902 Hypoxemia: Secondary | ICD-10-CM

## 2016-04-22 DIAGNOSIS — K56609 Unspecified intestinal obstruction, unspecified as to partial versus complete obstruction: Secondary | ICD-10-CM

## 2016-04-22 DIAGNOSIS — R7881 Bacteremia: Secondary | ICD-10-CM | POA: Diagnosis not present

## 2016-04-22 HISTORY — DX: Alcohol abuse, uncomplicated: F10.10

## 2016-04-22 LAB — CBC WITH DIFFERENTIAL/PLATELET
BASOS PCT: 1 %
Basophils Absolute: 0.1 10*3/uL (ref 0.0–0.1)
EOS ABS: 0.2 10*3/uL (ref 0.0–0.7)
Eosinophils Relative: 3 %
HCT: 21.5 % — ABNORMAL LOW (ref 39.0–52.0)
HEMOGLOBIN: 7.8 g/dL — AB (ref 13.0–17.0)
Lymphocytes Relative: 19 %
Lymphs Abs: 1.7 10*3/uL (ref 0.7–4.0)
MCH: 32.9 pg (ref 26.0–34.0)
MCHC: 36.3 g/dL — ABNORMAL HIGH (ref 30.0–36.0)
MCV: 90.7 fL (ref 78.0–100.0)
Monocytes Absolute: 1 10*3/uL (ref 0.1–1.0)
Monocytes Relative: 11 %
NEUTROS PCT: 67 %
Neutro Abs: 6 10*3/uL (ref 1.7–7.7)
Platelets: 110 10*3/uL — ABNORMAL LOW (ref 150–400)
RBC: 2.37 MIL/uL — AB (ref 4.22–5.81)
RDW: 15.3 % (ref 11.5–15.5)
WBC: 9 10*3/uL (ref 4.0–10.5)

## 2016-04-22 LAB — COMPREHENSIVE METABOLIC PANEL
ALK PHOS: 55 U/L (ref 38–126)
ALT: 52 U/L (ref 17–63)
AST: 121 U/L — AB (ref 15–41)
Albumin: 3.3 g/dL — ABNORMAL LOW (ref 3.5–5.0)
Anion gap: 14 (ref 5–15)
BUN: 36 mg/dL — AB (ref 6–20)
CALCIUM: 9.1 mg/dL (ref 8.9–10.3)
CHLORIDE: 88 mmol/L — AB (ref 101–111)
CO2: 25 mmol/L (ref 22–32)
CREATININE: 1.67 mg/dL — AB (ref 0.61–1.24)
GFR calc Af Amer: 55 mL/min — ABNORMAL LOW (ref >60)
GFR calc non Af Amer: 48 mL/min — ABNORMAL LOW (ref >60)
GLUCOSE: 121 mg/dL — AB (ref 65–99)
Potassium: 4 mmol/L (ref 3.5–5.1)
SODIUM: 127 mmol/L — AB (ref 135–145)
Total Bilirubin: 3.3 mg/dL — ABNORMAL HIGH (ref 0.3–1.2)
Total Protein: 5.8 g/dL — ABNORMAL LOW (ref 6.5–8.1)

## 2016-04-22 LAB — APTT: aPTT: 43 seconds — ABNORMAL HIGH (ref 24–36)

## 2016-04-22 LAB — BASIC METABOLIC PANEL
ANION GAP: 16 — AB (ref 5–15)
BUN: 37 mg/dL — ABNORMAL HIGH (ref 6–20)
CALCIUM: 9.2 mg/dL (ref 8.9–10.3)
CHLORIDE: 87 mmol/L — AB (ref 101–111)
CO2: 24 mmol/L (ref 22–32)
CREATININE: 1.61 mg/dL — AB (ref 0.61–1.24)
GFR calc non Af Amer: 50 mL/min — ABNORMAL LOW (ref >60)
GFR, EST AFRICAN AMERICAN: 58 mL/min — AB (ref >60)
Glucose, Bld: 120 mg/dL — ABNORMAL HIGH (ref 65–99)
Potassium: 4 mmol/L (ref 3.5–5.1)
SODIUM: 127 mmol/L — AB (ref 135–145)

## 2016-04-22 LAB — DIRECT ANTIGLOBULIN TEST (NOT AT ARMC)
DAT, IgG: NEGATIVE
DAT, complement: NEGATIVE

## 2016-04-22 LAB — CBC
HCT: 17.3 % — ABNORMAL LOW (ref 39.0–52.0)
HEMOGLOBIN: 6.3 g/dL — AB (ref 13.0–17.0)
MCH: 33.7 pg (ref 26.0–34.0)
MCHC: 36.4 g/dL — ABNORMAL HIGH (ref 30.0–36.0)
MCV: 92.5 fL (ref 78.0–100.0)
PLATELETS: 134 10*3/uL — AB (ref 150–400)
RBC: 1.87 MIL/uL — AB (ref 4.22–5.81)
RDW: 15.7 % — ABNORMAL HIGH (ref 11.5–15.5)
WBC: 11.5 10*3/uL — AB (ref 4.0–10.5)

## 2016-04-22 LAB — RETICULOCYTES
RBC.: 2.35 MIL/uL — AB (ref 4.22–5.81)
RETIC CT PCT: 1.6 % (ref 0.4–3.1)
Retic Count, Absolute: 37.6 10*3/uL (ref 19.0–186.0)

## 2016-04-22 LAB — AMMONIA: AMMONIA: 76 umol/L — AB (ref 9–35)

## 2016-04-22 LAB — I-STAT CG4 LACTIC ACID, ED: Lactic Acid, Venous: 1.99 mmol/L (ref 0.5–1.9)

## 2016-04-22 LAB — PREPARE RBC (CROSSMATCH)

## 2016-04-22 LAB — PROTIME-INR
INR: 1.58
Prothrombin Time: 19.1 seconds — ABNORMAL HIGH (ref 11.4–15.2)

## 2016-04-22 LAB — ABO/RH: ABO/RH(D): A POS

## 2016-04-22 LAB — LACTATE DEHYDROGENASE: LDH: 218 U/L — ABNORMAL HIGH (ref 98–192)

## 2016-04-22 LAB — ETHANOL: Alcohol, Ethyl (B): <5 mg/dL (ref <5)

## 2016-04-22 MED ORDER — SODIUM CHLORIDE 0.9 % IV SOLN
INTRAVENOUS | Status: DC
  Administered 2016-04-22: 17:00:00 via INTRAVENOUS

## 2016-04-22 MED ORDER — ACETAMINOPHEN 650 MG RE SUPP: 650.0000 mg | Freq: Four times a day (QID) | RECTAL | Status: DC | PRN

## 2016-04-22 MED ORDER — SODIUM CHLORIDE 0.9 % IV BOLUS (SEPSIS)
2000.0000 mL | Freq: Once | INTRAVENOUS | Status: AC
  Administered 2016-04-22: 2000 mL via INTRAVENOUS

## 2016-04-22 MED ORDER — ONDANSETRON HCL 4 MG PO TABS: 4.0000 mg | ORAL_TABLET | Freq: Four times a day (QID) | ORAL | Status: DC | PRN

## 2016-04-22 MED ORDER — ACETAMINOPHEN 325 MG PO TABS: 650.0000 mg | ORAL_TABLET | Freq: Four times a day (QID) | ORAL | Status: DC | PRN

## 2016-04-22 MED ORDER — SODIUM CHLORIDE 0.9 % IV SOLN
10.0000 mL/h | Freq: Once | INTRAVENOUS | Status: AC
  Administered 2016-04-22: 10 mL/h via INTRAVENOUS

## 2016-04-22 MED ORDER — SENNOSIDES-DOCUSATE SODIUM 8.6-50 MG PO TABS
1.0000 | ORAL_TABLET | Freq: Every evening | ORAL | Status: DC | PRN
  Filled 2016-04-22 (×2): qty 1

## 2016-04-22 MED ORDER — ONDANSETRON HCL 4 MG/2ML IJ SOLN: 4.0000 mg | Freq: Four times a day (QID) | INTRAMUSCULAR | Status: DC | PRN

## 2016-04-22 MED ORDER — ALPRAZOLAM 0.5 MG PO TABS
0.5000 mg | ORAL_TABLET | Freq: Three times a day (TID) | ORAL | Status: DC | PRN
  Filled 2016-04-22: qty 2

## 2016-04-22 MED ORDER — GABAPENTIN 300 MG PO CAPS
300.0000 mg | ORAL_CAPSULE | Freq: Three times a day (TID) | ORAL | Status: DC
  Administered 2016-04-22 – 2016-04-25 (×4): 300 mg via ORAL
  Filled 2016-04-22 (×5): qty 1

## 2016-04-22 MED ORDER — PANTOPRAZOLE SODIUM 40 MG PO TBEC
40.0000 mg | DELAYED_RELEASE_TABLET | Freq: Every day | ORAL | Status: DC
  Filled 2016-04-22: qty 1

## 2016-04-22 MED ORDER — DEXTROSE-NACL 5-0.45 % IV SOLN
INTRAVENOUS | Status: AC
  Administered 2016-04-22: via INTRAVENOUS

## 2016-04-22 MED ORDER — TRAMADOL HCL 50 MG PO TABS: 50.0000 mg | ORAL_TABLET | Freq: Four times a day (QID) | ORAL | Status: DC | PRN

## 2016-04-22 MED ORDER — SODIUM CHLORIDE 0.9% FLUSH
3.0000 mL | Freq: Two times a day (BID) | INTRAVENOUS | Status: DC
  Administered 2016-04-23 – 2016-04-26 (×7): 3 mL via INTRAVENOUS

## 2016-04-22 MED ORDER — DICLOFENAC SODIUM 75 MG PO TBEC
75.0000 mg | DELAYED_RELEASE_TABLET | Freq: Two times a day (BID) | ORAL | Status: DC
  Filled 2016-04-22: qty 1

## 2016-04-22 NOTE — ED Notes (Signed)
Admitting at bedside 

## 2016-04-22 NOTE — ED Provider Notes (Signed)
[facility]-EMERGENCY DEPT Provider Note   CSN: [id number] Arrival date & time: 26-Apr-2016  1518     History   Chief Complaint Chief Complaint  Patient presents with  . Weakness    HPI Ryan [name] is a 45 y.o. male.  45 year old male with over a month history of progressive weakness in his upper or lower extremities. 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 Hasn't followed up with them as this was several weeks ago. Patient's family notes that he has had periods of confusion and disorientation. There is some remote history of alcohol abuse. Continues to endorse progressive weakness worse with ambulation as well as with rest. Denies any chest or abdominal discomfort. No black or bloody stools. Mild headache without visual changes. Symptoms have been progressively worse and no treatment use prior to arrival      Past Medical History:  Diagnosis Date  . Hypertension     Patient Active Problem List   Diagnosis Date Noted  . Weakness 04/01/2016  . Abnormality of gait 04/01/2016  . 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 Essential hypertension 03/12/2016    History reviewed. No pertinent surgical history.     Home Medications    Prior to Admission medications   Medication Sig Start Date End Date Taking? Authorizing Provider  ALPRAZolam Prudy Feeler) 0.5 MG tablet Take 0.5 mg by mouth 3 (three) times daily.    Historical Provider, MD  gabapentin (NEURONTIN) 300 MG capsule Take 1 capsule (300 mg total) by mouth 3 (three) times daily. 03/12/16   Marvel Plan, MD  lisinopril (PRINIVIL,ZESTRIL) 40 MG tablet Take 40 mg by mouth daily.    Historical Provider, MD  omeprazole (PRILOSEC) 40 MG capsule Take 40 mg by mouth daily.    Historical Provider, MD  traMADol (ULTRAM) 50 MG tablet Take 1 tablet (50 mg total) by mouth every 6 (six) hours as needed. 04/01/16   Anson Fret, MD    Family History Family History    Problem Relation Age of Onset  . Heart disease Mother   . Heart disease Father   . Hypertension Brother     Social History Social History  Substance Use Topics  . Smoking status: Never Smoker  . Smokeless tobacco: Never Used  . Alcohol use Yes     Comment: 4-6 beers a week     Allergies   Patient has no known allergies.   Review of Systems Review of Systems  All other systems reviewed and are negative.    Physical Exam Updated Vital Signs BP (!) 87/46 (BP Location: Right Arm) Comment: 2nd BP 80/44  Pulse 99   Temp 97.9 F (36.6 C) (Oral)   Resp 19   SpO2 98%   Physical Exam  Constitutional: He is oriented to person, place, and time. He appears well-developed and well-nourished. He appears lethargic.  Non-toxic appearance. No distress.  HENT:  Head: Normocephalic and atraumatic.  Eyes: Conjunctivae, EOM and lids are normal. Pupils are equal, round, and reactive to light.  Neck: Normal range of motion. Neck supple. No tracheal deviation present. No thyroid mass present.  Cardiovascular: Normal rate, regular rhythm and normal heart sounds.  Exam reveals no gallop.   No murmur heard. Pulmonary/Chest: Effort normal and breath sounds normal. No stridor. No respiratory distress. He has no decreased breath sounds. He has no wheezes. He has no rhonchi. He has no rales.  Abdominal: Soft. Normal appearance and bowel sounds are normal. He exhibits  no distension. There is no tenderness. There is no rebound and no CVA tenderness.  Musculoskeletal: Normal range of motion. He exhibits no edema or tenderness.  Neurological: He is oriented to person, place, and time. He appears lethargic. He displays atrophy. No cranial nerve deficit or sensory deficit. GCS eye subscore is 4. GCS verbal subscore is 5. GCS motor subscore is 6.  Diffuse weakness noted in upper or lower extremities. No facial asymmetry.  Skin: Skin is warm and dry. No abrasion and no rash noted.  Psychiatric: He has a  normal mood and affect. His speech is normal and behavior is normal.  Nursing note and vitals reviewed.    ED Treatments / Results  Labs (all labs ordered are listed, but only abnormal results are displayed) Labs Reviewed  BASIC METABOLIC PANEL - Abnormal; Notable for the following:       Result Value   Sodium 127 (*)    Chloride 87 (*)    Glucose, Bld 120 (*)    BUN 37 (*)    Creatinine, Ser 1.61 (*)    GFR calc non Af Amer 50 (*)    GFR calc Af Amer 58 (*)    Anion gap 16 (*)    All other components within normal limits  CBC - Abnormal; Notable for the following:    WBC 11.5 (*)    RBC 1.87 (*)    Hemoglobin 6.3 (*)    HCT 17.3 (*)    MCHC 36.4 (*)    RDW 15.7 (*)    Platelets 134 (*)    All other components within normal limits  URINALYSIS, ROUTINE W REFLEX MICROSCOPIC (NOT AT [facility])  COMPREHENSIVE METABOLIC PANEL  PROTIME-INR  APTT  AMMONIA  CBG MONITORING, ED  I-STAT CG4 LACTIC ACID, ED  TYPE AND SCREEN    EKG  EKG Interpretation  Date/Time:  Thursday April 22 2016 15:43:57 EST Ventricular Rate:  96 PR Interval:  114 QRS Duration: 82 QT Interval:  400 QTC Calculation: 505 R Axis:   53 Text Interpretation:  Normal sinus rhythm Nonspecific ST and T wave abnormality Prolonged QT Abnormal ECG Confirmed by Ceasia Elwell  MD, Herta Hink ([id number]) on 04/28/2016 4:07:34 PM       Radiology No results found.  Procedures Procedures (including critical care time)  Medications Ordered in ED Medications  0.9 %  sodium chloride infusion (not administered)  sodium chloride 0.9 % bolus 2,000 mL (2,000 mLs Intravenous New Bag/Given 04/26/2016 1636)     Initial Impression / Assessment and Plan / ED Course  I have reviewed the triage vital signs and the nursing notes.  Pertinent labs & imaging results that were available during my care of the patient were reviewed by me and considered in my medical decision making (see chart for details).  Clinical Course     Patient  not to be severely anemic and blood transfusion ordered. Also given IV fluid for his hypotension. Responded well. Repeat blood pressure now 100s. Do not think this represents sepsis. He is not febrile. Consult and with the medicine service and they will make the patient.  The patient is noted to have a MAP's <65/ SBP's <90. With the current information available to me, I don't think the patient is in septic shock. The MAP's <65/ SBP's <90, is related to an acute condition that is not due to an infectionvolume depletion.  CRITICAL CARE Performed by: Toy BakerALLEN,Yoselin Amerman T Total critical care time: 50 minutes Critical care time was exclusive of separately  billable procedures and treating other patients. Critical care was necessary to treat or prevent imminent or life-threatening deterioration. Critical care was time spent personally by me on the following activities: development of treatment plan with patient and/or surrogate as well as nursing, discussions with consultants, evaluation of patient's response to treatment, examination of patient, obtaining history from patient or surrogate, ordering and performing treatments and interventions, ordering and review of laboratory studies, ordering and review of radiographic studies, pulse oximetry and re-evaluation of patient's condition.   Final Clinical Impressions(s) / ED Diagnoses   Final diagnoses:  None    New Prescriptions New Prescriptions   No medications on file     Lorre NickAnthony Astin Sayre, MD 04/11/2016 1726

## 2016-04-22 NOTE — ED Triage Notes (Signed)
Pt has been progressively weak, no strength in hands, cannot eat- went to MD and had multiple MRIs. MD told him it was pinched nerve. Pt jaundice/gray appearing in triage. Pt not able to have BMs regularly. Reports the last one was two weeks ago. These symptoms have been going on for over a month.

## 2016-04-22 NOTE — ED Notes (Signed)
Consent for blood signed and at bedside.

## 2016-04-22 NOTE — H&P (Signed)
Date: 04/12/2016               Patient Name:  Ryan [name] MRN: [id number]  DOB: 02/15/1971 Age / Sex: 45 y.o., male   PCP: No primary care provider on file.         Medical Service: Internal Medicine Teaching Service         Attending Physician: Dr. Riley ChurchesJim Granfortuna    First Contact: Dr. Althia FortsAdam Aymar Whitfill Pager: [phone]  Second Contact: Dr. Deneise LeverParth Saraiya Pager: [phone]       After Hours (After 5p/  First Contact Pager: [phone]  weekends / holidays): Second Contact Pager: [phone]   Chief Complaint: Weakness  History of Present Illness: Ryan [name] is a 45 y.o. gentleman with PMH HTN who presents for progressive weakness.  He report progressive weakness in his upper or lower extremities over the past 2 months and was.seen by Dr. Ezzard FlaxYan/neurology and underwent EMG which was abnormal. Dagnosed with an acute polyradiculoneuropathy, axonal dominant, and neurology is considering IVIG treatment. He had negative MRI brain, cervical, and thoracic spine on 11/3. He was scheduled to undergo LP this coming Monday but he has lost the ability to walk at home and his elderly parents are having great difficulty lifting him at home. He was walking with a cane as early as 2 weeks ago. Family reports that he has had periods of confusion and disorientation. Family reports this past week he has lost strength in his hands and can no longer open jars or button clothing. Family reports the patient has nose bleeds on a near daily basis that last about 2 hours. They have also noticed a yellow tinge to his skin for the past several days. He denies any chest, dyspnea, pain, fevers, chills, abdominal discomfort. No black or bloody stools. He reports not having a BM in 2 weeks and having difficulty urinating today.  In the ED - afebrile, HR 99, RR 19, BP 87/46, SpO2 98% on RA with lethargy and diffuse weakness in upper and lower extremities. Initial labs remarkable for BMP with Na 127, Cr 1.61 (baseline 0.8), gap 16. CBC  with WBC 11.5, Hb 6.3 (from 10.9 3 weeks ago, MCV 92), plts 134. DAT negative. CMP with AST 121, Tbili 3.3. Lactic acid 1.99. Retics 1.6%. He received 2L IVF and 2 units pRBCs and IMTS was contacted for admission.  Meds:  Current Meds  Medication Sig  . ALPRAZolam (XANAX) 0.5 MG tablet Take 0.5 mg by mouth 3 (three) times daily as needed for anxiety.   . diclofenac (VOLTAREN) 75 MG EC tablet Take 75 mg by mouth 2 (two) times daily with a meal.  . gabapentin (NEURONTIN) 300 MG capsule Take 1 capsule (300 mg total) by mouth 3 (three) times daily.  Marland Kitchen. lisinopril (PRINIVIL,ZESTRIL) 40 MG tablet Take 40 mg by mouth daily.  Marland Kitchen. omeprazole (PRILOSEC) 40 MG capsule Take 40 mg by mouth daily as needed (for acid reflux).   . traMADol (ULTRAM) 50 MG tablet Take 1 tablet (50 mg total) by mouth every 6 (six) hours as needed. (Patient taking differently: Take 50 mg by mouth every 6 (six) hours as needed for moderate pain. )    Allergies: Allergies as of 05/05/2016  . (No Known Allergies)   Past Medical History:  Diagnosis Date  . Hypertension     Family History:  Family History  Problem Relation Age of Onset  . Heart disease Mother   . Heart disease Father   . Hypertension  Brother     Social History:  Social History   Social History  . Marital status: Single    Spouse name: N/A  . Number of children: N/A  . Years of education: N/A   Occupational History  .      [employer]   Social History Main Topics  . Smoking status: Never Smoker  . Smokeless tobacco: Never Used  . Alcohol use Yes     Comment: 4-6 beers a week  . Drug use: No  . Sexual activity: Not on file   Other Topics Concern  . Not on file   Social History Narrative  . No narrative on file    Review of Systems: A complete ROS was negative except as per HPI.   Physical Exam: Blood pressure 104/67, pulse 102, temperature 98.1 F (36.7 C), temperature source Oral, resp. rate 16, SpO2 98 %.  General appearance: Obese,  tired-appearing, awkwardly splayed in ED stretcher, trying to urinate in urinal jug in bed, in mild distress, unable to sit himself up  HENT: Normocephalic, atraumatic, moist but pale mucous membranes, nares with dried blood bilaterally, neck supple Eyes: PERRL, EOM inact Cardiovascular: Tachycardic rate and regular rhythm, no murmurs, rubs, gallops Respiratory: Clear to auscultation bilaterally, normal work of breathing Abdomen: Obese, BS+, soft, non-tender, non-distended Extremities: Normal bulk with impaired range of motion, no edema, 2+ peripheral pulses Skin: Warm, dry, intact, mild jaundice Neuro: Alert but confused often in conversation, oriented to person and place but not to time, cranial nerves grossly intact, 2-3/5 distal upper extremities, 4/5 proximal and distal lower extremity muscles. Mild decreased sensation in hands and lower legs. Coordination intact. Reflexes 2+ throughout, gait deferred. Psych: Odd affect, distracted, clear speech, intermittent confusion   EKG: NSR CXR: No acute findings  Assessment & Plan by Problem:  Anemia, acute Hb 6.3 from 10.9 a few weeks ago, normal MCV, elevated TBili, concerning for acute hemolytic process. Negative DAT. Retics not adequately elevated at 1.6%Transfusing 2 units pRBCs. - Follow up post-transfusion CBC - U/A - PBS - Haptoglobin - LDH - AM CBC  Acute inflammatory demyelating polyradiculoneuropathy, progressive over 2 months, now has lost ability to walk and hand strength, EMG/NCS via Dr. Terrace ArabiaYan, MRI brain, cervical, thoracic spine negative for abnormality. Planned for LP and considering IVIG. Elderly parents cannot physically take care of him at home as he has become dependent in ADLs/non-ambulatory - Consult neurology in AM  FEN/GI: Regular diet, replete electrolytes as needed  DVT ppx: Lovenox  Code status: Full code  Dispo: Admit patient to Inpatient with expected length of stay greater than 2 midnights.  Signed: Althia FortsAdam  Dayzha Pogosyan, MD 24-Mar-2016, 6:09 PM  Pager: [phone]

## 2016-04-22 NOTE — ED Notes (Signed)
Pt has a condom cath in place, no urine noted in condom cath.

## 2016-04-22 NOTE — ED Notes (Signed)
Notified MD of hypotension, patient blood is ready. Given verbal order to bolus one unit of typed blood.

## 2016-04-22 NOTE — ED Notes (Signed)
Pt's family reports that he has hallucinated at times- seeing people in the carport that are not there. Pt is not able to walk well. These symptoms have started over the last 3 months.

## 2016-04-22 NOTE — ED Notes (Signed)
CRITICAL VALUE ALERT  Critical value received:  Hgb 6.3  Date of notification: Apr 24, 2016  Time of notification: 1608  Critical value read back: yes  Nurse who received alert: Fredirick MaudlinHolley Kalev Temme RN   MD notified: MD Freida BusmanAllen

## 2016-04-23 ENCOUNTER — Inpatient Hospital Stay (HOSPITAL_COMMUNITY): Payer: BLUE CROSS/BLUE SHIELD

## 2016-04-23 ENCOUNTER — Encounter (HOSPITAL_COMMUNITY): Payer: Self-pay | Admitting: Internal Medicine

## 2016-04-23 DIAGNOSIS — J80 Acute respiratory distress syndrome: Secondary | ICD-10-CM

## 2016-04-23 DIAGNOSIS — Z9981 Dependence on supplemental oxygen: Secondary | ICD-10-CM

## 2016-04-23 DIAGNOSIS — R531 Weakness: Secondary | ICD-10-CM

## 2016-04-23 DIAGNOSIS — R208 Other disturbances of skin sensation: Secondary | ICD-10-CM

## 2016-04-23 DIAGNOSIS — G9340 Encephalopathy, unspecified: Secondary | ICD-10-CM

## 2016-04-23 LAB — BASIC METABOLIC PANEL
Anion gap: 14 (ref 5–15)
BUN: 33 mg/dL — AB (ref 6–20)
CALCIUM: 8.4 mg/dL — AB (ref 8.9–10.3)
CO2: 20 mmol/L — AB (ref 22–32)
CREATININE: 1.48 mg/dL — AB (ref 0.61–1.24)
Chloride: 94 mmol/L — ABNORMAL LOW (ref 101–111)
GFR calc non Af Amer: 55 mL/min — ABNORMAL LOW (ref >60)
Glucose, Bld: 135 mg/dL — ABNORMAL HIGH (ref 65–99)
Potassium: 3.8 mmol/L (ref 3.5–5.1)
Sodium: 128 mmol/L — ABNORMAL LOW (ref 135–145)

## 2016-04-23 LAB — CBC
HCT: 18.1 % — ABNORMAL LOW (ref 39.0–52.0)
HEMATOCRIT: 21.2 % — AB (ref 39.0–52.0)
HEMOGLOBIN: 6.5 g/dL — AB (ref 13.0–17.0)
Hemoglobin: 7.7 g/dL — ABNORMAL LOW (ref 13.0–17.0)
MCH: 32.6 pg (ref 26.0–34.0)
MCH: 32.7 pg (ref 26.0–34.0)
MCHC: 36.3 g/dL — ABNORMAL HIGH (ref 30.0–36.0)
MCHC: 35.9 g/dL (ref 30.0–36.0)
MCV: 89.8 fL (ref 78.0–100.0)
MCV: 91 fL (ref 78.0–100.0)
PLATELETS: 110 10*3/uL — AB (ref 150–400)
Platelets: 105 10*3/uL — ABNORMAL LOW (ref 150–400)
RBC: 2.36 MIL/uL — AB (ref 4.22–5.81)
RBC: 1.99 MIL/uL — AB (ref 4.22–5.81)
RDW: 16.1 % — ABNORMAL HIGH (ref 11.5–15.5)
RDW: 16.9 % — ABNORMAL HIGH (ref 11.5–15.5)
WBC: 9.4 10*3/uL (ref 4.0–10.5)
WBC: 7.9 10*3/uL (ref 4.0–10.5)

## 2016-04-23 LAB — POCT I-STAT 3, ART BLOOD GAS (G3+)
Bicarbonate: 25.6 mmol/L (ref 20.0–28.0)
O2 Saturation: 97 %
PH ART: 7.366 (ref 7.350–7.450)
TCO2: 27 mmol/L (ref 0–100)
pCO2 arterial: 44.6 mmHg (ref 32.0–48.0)
pO2, Arterial: 92 mmHg (ref 83.0–108.0)

## 2016-04-23 LAB — CORTISOL: CORTISOL PLASMA: 16.3 ug/dL

## 2016-04-23 LAB — COMPREHENSIVE METABOLIC PANEL
ALT: 50 U/L (ref 17–63)
AST: 112 U/L — AB (ref 15–41)
Albumin: 3.1 g/dL — ABNORMAL LOW (ref 3.5–5.0)
Alkaline Phosphatase: 52 U/L (ref 38–126)
Anion gap: 12 (ref 5–15)
BILIRUBIN TOTAL: 4.1 mg/dL — AB (ref 0.3–1.2)
BUN: 31 mg/dL — AB (ref 6–20)
CALCIUM: 8.5 mg/dL — AB (ref 8.9–10.3)
CHLORIDE: 95 mmol/L — AB (ref 101–111)
CO2: 22 mmol/L (ref 22–32)
CREATININE: 1.38 mg/dL — AB (ref 0.61–1.24)
GFR, EST NON AFRICAN AMERICAN: 60 mL/min — AB (ref >60)
Glucose, Bld: 139 mg/dL — ABNORMAL HIGH (ref 65–99)
Potassium: 3.6 mmol/L (ref 3.5–5.1)
Sodium: 129 mmol/L — ABNORMAL LOW (ref 135–145)
TOTAL PROTEIN: 5.7 g/dL — AB (ref 6.5–8.1)

## 2016-04-23 LAB — SODIUM, URINE, RANDOM: SODIUM UR: 27 mmol/L

## 2016-04-23 LAB — RAPID URINE DRUG SCREEN, HOSP PERFORMED
AMPHETAMINES: NOT DETECTED
Barbiturates: NOT DETECTED
Benzodiazepines: NOT DETECTED
Cocaine: NOT DETECTED
OPIATES: NOT DETECTED
Tetrahydrocannabinol: NOT DETECTED

## 2016-04-23 LAB — URINALYSIS, ROUTINE W REFLEX MICROSCOPIC
Glucose, UA: NEGATIVE mg/dL
HGB URINE DIPSTICK: NEGATIVE
KETONES UR: 15 mg/dL — AB
Leukocytes, UA: NEGATIVE
NITRITE: NEGATIVE
PROTEIN: NEGATIVE mg/dL
SPECIFIC GRAVITY, URINE: 1.014 (ref 1.005–1.030)
pH: 6 (ref 5.0–8.0)

## 2016-04-23 LAB — CBC WITH DIFFERENTIAL/PLATELET
BASOS ABS: 0 10*3/uL (ref 0.0–0.1)
BASOS PCT: 0 %
EOS ABS: 0.2 10*3/uL (ref 0.0–0.7)
Eosinophils Relative: 3 %
HCT: 19.6 % — ABNORMAL LOW (ref 39.0–52.0)
HEMOGLOBIN: 7.1 g/dL — AB (ref 13.0–17.0)
Lymphocytes Relative: 17 %
Lymphs Abs: 1.5 10*3/uL (ref 0.7–4.0)
MCH: 32.7 pg (ref 26.0–34.0)
MCHC: 36.2 g/dL — AB (ref 30.0–36.0)
MCV: 90.3 fL (ref 78.0–100.0)
MONOS PCT: 13 %
Monocytes Absolute: 1.1 10*3/uL — ABNORMAL HIGH (ref 0.1–1.0)
NEUTROS ABS: 6.1 10*3/uL (ref 1.7–7.7)
NEUTROS PCT: 68 %
Platelets: 97 10*3/uL — ABNORMAL LOW (ref 150–400)
RBC: 2.17 MIL/uL — ABNORMAL LOW (ref 4.22–5.81)
RDW: 16.2 % — AB (ref 11.5–15.5)
WBC: 9 10*3/uL (ref 4.0–10.5)

## 2016-04-23 LAB — OSMOLALITY: OSMOLALITY: 282 mosm/kg (ref 275–295)

## 2016-04-23 LAB — SAVE SMEAR

## 2016-04-23 LAB — FIBRINOGEN: FIBRINOGEN: 260 mg/dL (ref 210–475)

## 2016-04-23 LAB — APTT: APTT: 46 s — AB (ref 24–36)

## 2016-04-23 LAB — OSMOLALITY, URINE: Osmolality, Ur: 361 mOsm/kg (ref 300–900)

## 2016-04-23 LAB — PROTIME-INR
INR: 1.63
PROTHROMBIN TIME: 19.5 s — AB (ref 11.4–15.2)

## 2016-04-23 LAB — GLUCOSE, CAPILLARY: GLUCOSE-CAPILLARY: 138 mg/dL — AB (ref 65–99)

## 2016-04-23 LAB — PREPARE RBC (CROSSMATCH)

## 2016-04-23 LAB — MRSA PCR SCREENING: MRSA BY PCR: POSITIVE — AB

## 2016-04-23 LAB — CREATININE, URINE, RANDOM: Creatinine, Urine: 95.31 mg/dL

## 2016-04-23 LAB — PATHOLOGIST SMEAR REVIEW

## 2016-04-23 MED ORDER — DEXTROSE-NACL 5-0.45 % IV SOLN: INTRAVENOUS | Status: AC

## 2016-04-23 MED ORDER — NALOXONE HCL 0.4 MG/ML IJ SOLN
INTRAMUSCULAR | Status: AC
  Administered 2016-04-23: 0.4 mg
  Filled 2016-04-23: qty 1

## 2016-04-23 MED ORDER — PANTOPRAZOLE SODIUM 40 MG IV SOLR
40.0000 mg | Freq: Every day | INTRAVENOUS | Status: DC
  Administered 2016-04-23 – 2016-04-26 (×4): 40 mg via INTRAVENOUS
  Filled 2016-04-23 (×4): qty 40

## 2016-04-23 MED ORDER — THIAMINE HCL 100 MG/ML IJ SOLN
100.0000 mg | Freq: Every day | INTRAMUSCULAR | Status: DC
  Administered 2016-04-23 – 2016-04-24 (×2): 100 mg via INTRAVENOUS
  Filled 2016-04-23 (×2): qty 2

## 2016-04-23 MED ORDER — NALOXONE HCL 0.4 MG/ML IJ SOLN
INTRAMUSCULAR | Status: AC
  Administered 2016-04-23: 0.8 mg
  Filled 2016-04-23: qty 2

## 2016-04-23 MED ORDER — SODIUM CHLORIDE 0.9 % IV SOLN
Freq: Once | INTRAVENOUS | Status: AC
  Administered 2016-04-23: 20:00:00 via INTRAVENOUS

## 2016-04-23 MED ORDER — DEXTROSE 5 % IV SOLN
5.0000 mg | Freq: Once | INTRAVENOUS | Status: AC
  Administered 2016-04-24: 5 mg via INTRAVENOUS
  Filled 2016-04-23: qty 0.5

## 2016-04-23 MED ORDER — MUPIROCIN 2 % EX OINT
1.0000 "application " | TOPICAL_OINTMENT | Freq: Two times a day (BID) | CUTANEOUS | Status: DC
  Administered 2016-04-23 – 2016-04-26 (×8): 1 via NASAL
  Filled 2016-04-23 (×2): qty 22

## 2016-04-23 MED ORDER — ADULT MULTIVITAMIN W/MINERALS CH
1.0000 | ORAL_TABLET | Freq: Every day | ORAL | Status: DC
  Administered 2016-04-24 – 2016-04-25 (×2): 1 via ORAL
  Filled 2016-04-23 (×3): qty 1

## 2016-04-23 MED ORDER — FOLIC ACID 5 MG/ML IJ SOLN
1.0000 mg | Freq: Every day | INTRAMUSCULAR | Status: DC
  Administered 2016-04-23 – 2016-04-24 (×2): 1 mg via INTRAVENOUS
  Filled 2016-04-23 (×3): qty 0.2

## 2016-04-23 MED ORDER — VITAMIN K1 10 MG/ML IJ SOLN
10.0000 mg | Freq: Once | INTRAVENOUS | Status: AC
  Administered 2016-04-23: 10 mg via INTRAVENOUS
  Filled 2016-04-23: qty 1

## 2016-04-23 MED ORDER — PHYTONADIONE 5 MG PO TABS
10.0000 mg | ORAL_TABLET | Freq: Once | ORAL | Status: DC
  Filled 2016-04-23: qty 2

## 2016-04-23 MED ORDER — LORAZEPAM 1 MG PO TABS: 1.0000 mg | ORAL_TABLET | Freq: Four times a day (QID) | ORAL | Status: DC | PRN

## 2016-04-23 MED ORDER — LACTULOSE ENEMA
300.0000 mL | Freq: Once | ORAL | Status: AC
  Administered 2016-04-24: 300 mL via RECTAL
  Filled 2016-04-23: qty 300

## 2016-04-23 MED ORDER — LORAZEPAM 2 MG/ML IJ SOLN
1.0000 mg | Freq: Four times a day (QID) | INTRAMUSCULAR | Status: DC | PRN
  Administered 2016-04-23: 1 mg via INTRAVENOUS

## 2016-04-23 MED ORDER — PHYTONADIONE 5 MG PO TABS: 5.0000 mg | ORAL_TABLET | Freq: Every day | ORAL | Status: DC

## 2016-04-23 MED ORDER — CHLORHEXIDINE GLUCONATE CLOTH 2 % EX PADS
6.0000 | MEDICATED_PAD | Freq: Every day | CUTANEOUS | Status: DC
  Administered 2016-04-23 – 2016-04-26 (×4): 6 via TOPICAL

## 2016-04-23 MED ORDER — ENSURE ENLIVE PO LIQD: 237.0000 mL | Freq: Two times a day (BID) | ORAL | Status: DC

## 2016-04-23 NOTE — Consult Note (Addendum)
Neurology Consult Note  Reason for Consultation: Progressive weakness  Requesting provider: Asencion Partridge, MD  CC: None  HPI: This is a 81-yo man who presented to the [facility] ED complaining of weakness. He is a terrible historian who currently tells me that he is not having any weakness in his arms or legs despite obvious evidence to the contrary. He is encephalopathic. No family is present. History is therefore limited to the review of the patient's medical record, including outpatient neurology where he has been seen for this progressive weakness.   He first saw Dr. Rosalin Hawking at [facility] on 03/12/16. According to Dr. Phoebe Sharps note, the patient was in his usual state of health until June of 2017 when he apparently developed GI symptoms including abdominal pain, nausea, and vomiting. He was diagnosed with gastritis on an EGD and treated with Prilosec with resolution of symptoms. On 02/19/16, he noted some stiffness in his legs and was having some trouble walking. On 02/20/16, he woke up with weakness in both legs, reportedly unable to pick them up with resultant trouble ambulating. He reported that the weakness seemed to involve his thighs primarily with better strength in the distal legs. He had some pain in his thighs but denied numbness or tingling. He saw his PCP who was concerned that he may have GBS. He was sent to the ED for evaluation where he was reportedly told that he did not have GBS and was sent home. One week later he noticed weakness in both hands, significant enough that he could no longer write. This was accompanied by painful dysesthesias in both hands. He did not appreciate any trouble talking or swallowing and denied double vision or weakness of the eyes or eyelids. On Dr. Phoebe Sharps exam, he noted weakness in all extremities that was symmetric in nature with greater weakness in the distal UEs and proximal LEs with sparing of deltoids, biceps, triceps, and plantar flexion.  DTRs were diminished at the triceps, brachioradialis, knees, and ankles bilaterally. Sensory exam was notable for a stocking-glove distribution loss of pinprick and vibration with sparing of proprioception. "Painful sensation" was noted in both hands, L>R, and both thighs. He was unable to walk due to his proximal LE weakness and was in a wheelchair. Cranial nerves were normal. He was referred to a neuromuscular specialist, Dr. Marcial Pacas, for EMG/NCV and further recommendations for workup.   He was then seen by Dr. Krista Blue at [facility] on 04/01/16. On her exam, she also noted normal cranial nerve function. She found that proximal strength was normal in BUE with moderate weakness of bilateral wrist flexion and extension, somewhat limited by neuropathic pain. He also had mild to moderate BLE weakness, both proximal and distal. DTRs were noted to be 2+ in the arms, absent in the legs. She noted decreased light touch in a stocking-glove distribution below the wrists and knees bilaterally. She performed EMG/NCV and ordered numerous labs, LP, and MRI brain, cervical spine, and thoracic spine for further evaluation.   EMG/NCV was performed on 04/01/16 with results as follows: "This is an abnormal study. There is electrodiagnostic evidence of widespread active neuropathic changes involving bilateral lumbar sacral myotomes, and milder degree left cervical myotomes. There was evidence of prolonged distal latency, F wave latency, mildly slow conduction velocity of multiple motor nerves tested, there is no evidence of temporal dispersion.   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  Extensive evaluation including  imaging study, CSF study, laboratory evaluation has initiated."  Labs from 04/01/16 as follows:   Vitamin B12 1399  Folate <2.0  HIV nonreactive  RPR nonreactive  CRP 9.1  TSH  3.810  Total T4 10.7  CK 68  Acute hepatitis panel negative  ANA negative  Serum copper 93  Ferritin 2860  Iron 159, TIBC <176, FeSat >90, UIBC <17  ACE <15  SPEP, IFE normal  CBC notable for Hgb 10.9, Hct 30.0, RBC 3.16, MCH 34.5, MCHC 36.3, RDW 15.8  Vitamin D, 25-OH 12.1  CMP notable for glucose 127, sodium 123, chloride 78, tbili 4.3, AST 176, ALT 75  B. Burgdorferi Abs negative  Imaging results are as follows:   MRI brain without contrast from 04/06/16:  "Normal MRI brain (without)."  MRI cervical spine without contrast from 04/06/16:  "Unremarkable MRI cervical spine (without). Patient motion artifact noted."  MRI thoracic spine without contrast from 04/06/16:  " Equivocal MRI thoracic spine (without). Tiny disc protrusions at T5-6 and T7-8. No spinal stenosis or foraminal stenosis."    PMH: Limited to chart review as the patient is a poor historian.  Past Medical History:  Diagnosis Date  . Hypertension     PSH: Limited to chart review as the patient is a poor historian.   History reviewed. No pertinent surgical history.  Family history: Limited to chart review as the patient is a poor historian.   Family History  Problem Relation Age of Onset  . Heart disease Mother   . Heart disease Father   . Hypertension Brother     Social history: Limited to chart review as the patient is a poor historian.   Social History   Social History  . Marital status: Single    Spouse name: N/A  . Number of children: N/A  . Years of education: N/A   Occupational History  .      [employer]   Social History Main Topics  . Smoking status: Never Smoker  . Smokeless tobacco: Never Used  . Alcohol use Yes     Comment: 4-6 beers a week  . Drug use: No  . Sexual activity: Not on file   Other Topics Concern  . Not on file   Social History Narrative  . No narrative on file    Current outpatient meds: Limited to chart review as the patient is a poor  historian.   Current Meds  Medication Sig  . ALPRAZolam (XANAX) 0.5 MG tablet Take 0.5 mg by mouth 3 (three) times daily as needed for anxiety.   . diclofenac (VOLTAREN) 75 MG EC tablet Take 75 mg by mouth 2 (two) times daily with a meal.  . gabapentin (NEURONTIN) 300 MG capsule Take 1 capsule (300 mg total) by mouth 3 (three) times daily.  Marland Kitchen lisinopril (PRINIVIL,ZESTRIL) 40 MG tablet Take 40 mg by mouth daily.  Marland Kitchen omeprazole (PRILOSEC) 40 MG capsule Take 40 mg by mouth daily as needed (for acid reflux).   . traMADol (ULTRAM) 50 MG tablet Take 1 tablet (50 mg total) by mouth every 6 (six) hours as needed. (Patient taking differently: Take 50 mg by mouth every 6 (six) hours as needed for moderate pain. )    Current inpatient meds: Limited to chart review as the patient is a poor historian.   Current Facility-Administered Medications  Medication Dose Route Frequency Provider Last Rate Last Dose  . 0.9 %  sodium chloride infusion   Intravenous Continuous Lacretia Leigh, MD   Stopped  at 04/23/16 0130  . acetaminophen (TYLENOL) tablet 650 mg  650 mg Oral Q6H PRN Burgess Estelle, MD       Or  . acetaminophen (TYLENOL) suppository 650 mg  650 mg Rectal Q6H PRN Burgess Estelle, MD      . ALPRAZolam Duanne Moron) tablet 0.5 mg  0.5 mg Oral TID PRN Burgess Estelle, MD      . dextrose 5 %-0.45 % sodium chloride infusion   Intravenous Continuous Burgess Estelle, MD 100 mL/hr at 04/14/2016 2337    . gabapentin (NEURONTIN) capsule 300 mg  300 mg Oral TID Burgess Estelle, MD   300 mg at 04/26/2016 2333  . ondansetron (ZOFRAN) tablet 4 mg  4 mg Oral Q6H PRN Burgess Estelle, MD       Or  . ondansetron (ZOFRAN) injection 4 mg  4 mg Intravenous Q6H PRN Burgess Estelle, MD      . pantoprazole (PROTONIX) EC tablet 40 mg  40 mg Oral Daily Burgess Estelle, MD      . senna-docusate (Senokot-S) tablet 1 tablet  1 tablet Oral QHS PRN Burgess Estelle, MD      . sodium chloride flush (NS) 0.9 % injection 3 mL  3 mL Intravenous Q12H Burgess Estelle, MD   3 mL at 04/23/16 0255  . traMADol (ULTRAM) tablet 50 mg  50 mg Oral Q6H PRN Burgess Estelle, MD        Allergies: No Known Allergies  ROS: As per HPI. A full 14-point review of systems is limited by poor cooperation. He perseverated on his dry mouth and is not endorsing any other complaints.  PE:  BP 131/85 (BP Location: Right Arm)   Pulse (!) 110   Temp 98.3 F (36.8 C) (Axillary)   Resp 16   Ht 5' 9.5" (1.765 m)   Wt 96.8 kg (213 lb 6.5 oz)   SpO2 100%   BMI 31.06 kg/m   General: Disheveled-appearing Caucasian male lying in hospital bed. He appears jaundiced. He is alert and oriented to self, [place], and [employer]. He has little insight into his situation and wants to know if he can go to his friend's wedding. When I asked him when this wedding was taking place, he said "sometime after we get out of here." Effort with the exam is variable but overall poor. Speech clear, no dysarthria. No aphasia. Follows commands briskly. Affect is bright with congruent mood. Comportment is normal.  HEENT: Normocephalic. Neck supple without LAD. Mucous membranes dry, OP clear. He appears to have some dried blood in his mouth. Dentition good. No conjunctival injection.  CV: Tachy, regular, no murmur. Carotid pulses full and symmetric, no bruits. Distal pulses 2+ and symmetric.  Lungs: CTAB.  Abdomen: Obese,soft, non-distended, non-tender. Bowel sounds present x4.  Extremities: No C/C. He has mild edema in BLE, R>L. Neuro:  CN: Pupils are equal and round. They are symmetrically reactive from 3-->2 mm. EOMI notable for saccadic pursuits without nystagmus. No reported diplopia. Facial sensation is intact to light touch. Face is symmetric at rest with normal strength and mobility. Hearing is intact to conversational voice. Palate elevates symmetrically and uvula is midline. Voice is normal in tone, pitch and quality. Bilateral SCM and trapezii are 5/5. Tongue is midline with normal bulk and  mobility.  Motor: Normal bulk and tone. His strength exam was markedly limited by inconsistent and poor effort. He appears to have diffuse LE weakness and distal UE weakness but this it difficult to quantify on this limited  exam.  No fasciculations observed. No tremor or other abnormal movements.  Sensation: Effort was variable but he indicated absent vibration in BLE and inconsistent decrease in pinprick in both lower legs and both forearms.  DTRs: 2+ both biceps, 1+ both brachioradialis, absent BLE. Toes downgoing bilaterally. No pathologic reflexes.  Coordination: Finger-to-nose and heel-to-shin are limited by effort and weakness.  Gait: Deferred due to LE weakness.    Labs:  Lab Results  Component Value Date   WBC 9.0 04/23/2016   HGB 7.1 (L) 04/23/2016   HCT 19.6 (L) 04/23/2016   PLT 97 (L) 04/23/2016   GLUCOSE 139 (H) 04/23/2016   ALT 50 04/23/2016   AST 112 (H) 04/23/2016   NA 129 (L) 04/23/2016   K 3.6 04/23/2016   CL 95 (L) 04/23/2016   CREATININE 1.38 (H) 04/23/2016   BUN 31 (H) 04/23/2016   CO2 22 04/23/2016   TSH 3.810 04/01/2016   INR 1.63 04/23/2016   Additional prior labs as outlined in the HPI.  Imaging:  I have personally and independently reviewed the MRI brain without contrast from 04/06/16. This is unremarkable.   I have personally and independently reviewed the MRI of the cervical spine without contrast from 04/06/16. Sagittal views show normal spinal cord and bony alignment without significant disk bulges. The axial views are of limited utility as they are degraded by motion.   I have personally and independently reviewed the MRI of the thoracic spine without contrast from 04/06/16. Mild disc bulges are noted at T5/6 and T7/8 which do not impact the adjacent neural structures. The cord appears normal.   Other diagnostic studies:  EMG/NCV results as noted in HPI  Assessment and Plan:  1. Subacute progressive axonal sensorimotor polyneuropathy: This involves  the upper and lower extremities in a symmetric fashion with sparing of the cranial nerves and axial muscles. EMG/NCV results indicated a predominately axonal process with some demyelinating features. The differential diagnosis is broad and would include: porphyria, lead poisoning, paraneoplastic neuropathy, amyloidosis, POEMS disease, chronic liver disease, sarcoidosis, nutritional deficiency (B12, folate, thiamine, hypophosphatemia), hypothyroidism, connective tissue disease, vasculitis, heavy metal toxicity, multiple myeloma, polycythemia vera, and AMSAN (acute motor and sensory axonal neuropathy, a variant of GBS). Peripheral neuropathy can occur with Cd59-mediated hemolytic anemia.   Pertinent lab results thus far include normal thyroid studies, mild transaminitis, normocytic anemia, low folate level, elevated CRP and ferritin suggesting systemic inflammatory process, hyponatremia, abnormal iron studies, negative HIV, nonreactive RPR, negative serum Lyme titers, normal thyroid studies, normal B12, negative ANA, normal CK, low vitamin D, normal SPEP/IFE, normal serum copper. Imaging of the brain and C/T spine were unremarkable.   Recommend as follows:   Check ANCA panel  Check serum phosphate  Repeat serum folate and replete if still low  Check urine heavy metal screen  Rule out porphyria  Check anti-myelin-associated glycoprotein antibodies  Check anti-ganglioside antibodies  Check anti-sulfatide antibodies  Check anti-GQ1b antibodies  Check serum paraneoplastic antibodies (anti-Hu, anti-CV2/CRMP5)  LP, send CSF for cell count with diff (tubes 1 and 4), protein, glucose, VDRL, oligoclonal bands (will require paired serum specimen), IgG index if coagulopathy and mentation allow  Continue gabapentin for pain control  2. Encephalopathy: He is clearly encephalopathic on my assessment today. Notes indicate that his parents reported some episodes of confusion. It is not clear what his  precise baseline is at this time. It is also unclear if this is connected to his neuropathy. He is hyponatremic which could be contributing. This could  also be reflective of underlying systemic illness. He has a history of EtOH and Xanax use so will need to watch for s/s of EtOH and benzo withdrawal and treat as needed. Minimize CNS active meds, especially opiates, anything with strong anticholinergic properties, and benzos (unless needed for withdrawal management).   3. Generalized weakness: This appears significant and is due to his neuropathy. PT/OT/rehab.   4. Dysesthesias: These are due to his neuropathy. Continue gabapentin.  5. Abnormality of gait: This is the result of his weakness. PT/rehab.

## 2016-04-23 NOTE — Progress Notes (Addendum)
Dr. Earlene PlaterWallace and I went to evaluate the patient at approximately 11:30 this evening at the request of the day team. The patient has had some altered mental status and one episode of respiratory distress earlier today which are new problems for the patient. Unclear etiology of these changes at this point. Nursing staff reports the patient has been comfortable and without complaints. Noted he has been lethargic however arousable with several verbal prompts and to pain. RN notified me earlier about >900 mL urinary retention and at that time I ordered an I&O cath. 2 attempts were unsuccesful and subsequently coude cath was inserted with 1,350 cc urine output.    Vital signs reviewed.  Nursing notes reviewed.  General: Sleeping middle-aged white male. In no acute distress. Awoke to repeatedly saying his name and also to painful stimuli although promptly fell asleep.  Pulmonary: CTA BL on anterior chest Cardiovascular: Regular rate and rhythm, no murmur appreciated Abdomen: Obese abdomen, other wise soft and non-tender. Not distended. Palpable hepatomegaly. Extremities: Without peripheral edema.  Skin: Plaque-like rash over nasolabial folds  Assessment & Plan: Patient appears comfortable and was without hypoxia on examination. He was arousable when saying his name several times or with painful stimuli. Nursing denies any changes in mental or respiratory status. Does not currently need advanced airway. Continue current management. -Receiving lactulose enema -PCCM aware of pt -Will continue to monitor. -Family was called and updated

## 2016-04-23 NOTE — ED Notes (Signed)
Pt requesting to leave, attempted to climb out of bed, removed one of his IVs, and upset at staff for not letting him ambulate to the bathroom. Pt had condom cath in place, that was removed due to discomfort. Two staff members assisted pt to side of bed to attempt to use urinal. Pt very unsteady, unable to stand even with assistance. Pt then apologized for wanting to leave and expressed understanding of why he cannot ambulate at this time. Xanax offered for increased anxiety, pt denied

## 2016-04-23 NOTE — Progress Notes (Signed)
1700 RN arrived in ultrasound. Pt HOB less than 20 degrees, pt with shallow respirations, mouth breathing.  Oxygen adjusted, pt stimulated but was unresponsive to painful stimulus.  Called 4N charge RN and requested respiratory therapy assistance.  Oxygen saturation improved with increased oxygen and stimulation of pt along with increasing HOB to greater than 30 degrees Transported pt back to 4N 04 with oxygen and telemetry on, monitored by RN.   TS residents paged and notified of :desaturations,shallow respiratory effort, diminished breath sounds and pt unarousable to painful stimulation during abdominal ultrasound.  Will continue to monitor pt closely.

## 2016-04-23 NOTE — Progress Notes (Signed)
Paged regarding respiratory distress.  Evaluated at bedside by Dr Isabella BowensKrall and myself.  This afternoon he was alert but confused and threatening to leave AMA.  When he was at ultrasound at ~1600, he was found to be in respiratory distress with desaturate to progressively from 70s to down to 30s.  He was put on nasal cannula and returned to his floor bed.  He was unresponsive and breathing with paradoxical abdominal motion. Unclear when exactly and under what circumstances his neurologic and respiratory status changed.  He received 1 mg of Ativan at 1354 and no other meds this afternoon. Given 1.2 mg Narcan without immediate response.  PCCM called to consult and pt evaluated by Dr Silvestre GunnerMannan.  Blood pressure (!) 94/54, pulse 93, temperature 98.6 F (37 C), temperature source Axillary, resp. rate 18, height 5' 9.5" (1.765 m), weight 213 lb 6.5 oz (96.8 kg), SpO2 100 %.  Physical Exam  Constitutional:  Unresponsive, mouth open, tachypneic with paradoxical breathing  HENT:  Dry lips Red stained lips and mouth (RN reported eating red jello)  Neck: No tracheal deviation present.  Cardiovascular:  Tachycardic RRR No MRG  Pulmonary/Chest: No stridor.  Tachypneic with shallow respirations Breath sounds present in anterior and lateral lung fields Good air movement No stridor, wheeze, rales  Musculoskeletal:  1+ bilateral LE pitting edema  Neurological:  GCS 7 (E1V2M4) Pupils 1.5 mm, round, symmetric, in light or dark with minimal light reaction Gaze conjugate and primary Gag reflex present but reduced DTRs mute bilateral ankle jerks, patella, biceps Mute Babinski bilateral  Skin: Skin is warm and dry.   ABG    Component Value Date/Time   PHART 7.366 04/23/2016 1736   PCO2ART 44.6 04/23/2016 1736   PO2ART 92.0 04/23/2016 1736   HCO3 25.6 04/23/2016 1736   TCO2 27 04/23/2016 1736   O2SAT 97.0 04/23/2016 1736   CBG (last 3)   Recent Labs  04/23/16 0740  GLUCAP 138*   Portable  CXR Low volumes Mild interstitial fluffy infiltrates bilaterally No PTX  A Acute mental status change in patient with prior encephalopathy and neuromuscular weakness.  Despite his paradoxical breathing, his oxygenation, ventilation, and acid base status are reassuring.  He has no convulsions, increased tone, gaze deviation, or pupillary asymetry to suggest seizure or mass effect.  He had a single low dose of benzodiazepine several hours prior which would not be expected to explain his change in clinical status, but perhaps his neurologic condition could sensitize him to sedating effects.  Does not currently need advanced airway. -CBC -Lactulose enema -Continue to monitor -PCCM aware

## 2016-04-23 NOTE — Progress Notes (Signed)
RN received phone call from CCMD that pt was desating per monitor.  RN called ultrasound and requested that oxygen be placed on pt and RN on the way to evaluate pt.

## 2016-04-23 NOTE — Progress Notes (Signed)
Subjective: Ryan [name] continues to remain encephalopathic this morning with poor memory and concentration. He continues to answer question inconsistently. He continues to express a strong desire to "go home so he can watch the game" and that he "come back to the hospital every day." He has very poor insight, and is a very poor historian. Ryan [name] tried to leave AMA this afternoon and has become increasingly agitated today. This morning he reported that he drinks 6-8 beers daily plus whiskey daily and has done so since 2000 and he was placed on CIWA protocol.  Objective: Vital signs in last 24 hours: Vitals:   04/23/16 0245 04/23/16 0346 04/23/16 0451 04/23/16 0736  BP:  130/91 (!) 144/82 131/85  Pulse: 104 103 (!) 102 (!) 110  Resp: 16 18 (!) 25 16  Temp:   98.1 F (36.7 C) 98.3 F (36.8 C)  TempSrc:   Oral Axillary  SpO2: 99% 98% 95% 100%  Weight:   96.8 kg (213 lb 6.5 oz)   Height:   5' 9.5" (1.765 m)     Intake/Output Summary (Last 24 hours) at 04/23/16 1129 Last data filed at 04/23/16 0600  Gross per 24 hour  Intake          5103.33 ml  Output              625 ml  Net          4478.33 ml    Physical Exam General appearance: Obese, tired-appearing, disheveled man laying in bed, poor cooperation with exam HENT: Normocephalic, atraumatic, moist/pale mucous membranes, lips ringed with dried food or blood, neck supple Eyes: PERRL, EOM intact, mild icterus Cardiovascular: Tachycardic rate and regular rhythm, no murmurs, rubs, gallops Respiratory: Clear to auscultation bilaterally, normal work of breathing Abdomen: Obese, BS+, soft, non-tender, non-distended, palpable hepatomegaly 5-6cm below the diaphragm Extremities: Normal bulk with impaired range of motion, no edema, 2+ peripheral pulses Skin: Warm, dry, plaque-like rash over nasolabial folds, scattered spider angiomata over skin of upper chest Neuro: Awake and alert, but confused often in conversation, oriented to person  and place but not to time, poor cooperating with exam, cranial nerves grossly intact, 4/5 distal upper extremities, 4/5 proximal and distal lower extremity muscles. Loss of sensation to light touch in hands to mid-forearm and feet up to mid-thigh. Reflexes 1+ throughout, gait deferred. Psych: Odd affect, distracted, dysarthric speech  Labs / Imaging / Procedures: CBC Latest Ref Rng & Units 04/23/2016 04/23/2016 03-07-2016  WBC 4.0 - 10.5 K/uL 9.0 9.4 9.0  Hemoglobin 13.0 - 17.0 g/dL 7.1(L) 7.7(L) 7.8(L)  Hematocrit 39.0 - 52.0 % 19.6(L) 21.2(L) 21.5(L)  Platelets 150 - 400 K/uL 97(L) 110(L) 110(L)   BMP Latest Ref Rng & Units 04/23/2016 04/23/2016 03-07-2016  Glucose 65 - 99 mg/dL 696(E139(H) 952(W135(H) 413(K121(H)  BUN 6 - 20 mg/dL 44(W31(H) 10(U33(H) 72(Z36(H)  Creatinine 0.61 - 1.24 mg/dL 3.66(Y1.38(H) 4.03(K1.48(H) 7.42(V1.67(H)  BUN/Creat Ratio 9 - 20 - - -  Sodium 135 - 145 mmol/L 129(L) 128(L) 127(L)  Potassium 3.5 - 5.1 mmol/L 3.6 3.8 4.0  Chloride 101 - 111 mmol/L 95(L) 94(L) 88(L)  CO2 22 - 32 mmol/L 22 20(L) 25  Calcium 8.9 - 10.3 mg/dL 9.5(G8.5(L) 3.8(V8.4(L) 9.1   Dg Chest Port 1 View  Result Date: 03-07-2016 CLINICAL DATA:  Increase weakness over several months. EXAM: PORTABLE CHEST 1 VIEW COMPARISON:  None. FINDINGS: The lungs are clear wiithout focal pneumonia, edema, pneumothorax or pleural effusion. Cardiopericardial silhouette is at upper limits  of normal for size. The visualized bony structures of the thorax are intact. Telemetry leads overlie the chest. IMPRESSION: No acute cardiopulmonary findings. Electronically Signed   By: Kennith CenterEric  Mansell M.D.   On: 06-30-2015 16:49    Assessment/Plan: Mr. Ryan FontJoseph [name] is a 45 y.o. man with PMH HTN and chronic alcohol abuse admitted for acute anemia and progressive weakness.  Anemia, hemolytic process likely related to chronic alcohol abuse vs vasculitic process, acute Hb 6.3 from 10.9 a few weeks ago, normal MCV, elevated TBili, elevated LDH, negative DAT. Retics not adequately  elevated at 1.6%. Improved to 7.8 s/p 2 units pRBCs. Previous lab workup revealed low folate, elevated B12, now with coagulopathy PT 19, INR 16, PTT 46 - Replete folate, thiamine - Replete vitamin K - PBS revealed spherocytes, target cells, PMNs with toxic granulations and some hypersegmentation consistent with chronic alcohol abuse but some concern for potential underlying infection - Haptoglobin pending - Check ANCA titer - Mycoplasma IgM - Trend CBCs  Acute inflammatory demyelating polyradiculoneuropathy, progressive over 2 months, now has lost ability to walk and hand strength, EMG/NCS via Dr. Terrace ArabiaYan, MRI brain, cervical, thoracic spine negative for abnormality. Planned for LP and considering IVIG. Elderly parents cannot physically take care of him at home as he has become dependent in ADLs is non-ambulatory. Previous workup per neurology: folate low <2, B12 high 1399, VitD low at 12, Ferritin elevated to 2800, hepatitis panel negative, CK normal, TSH normal, Copper normal, ANA, HIV, RPR negative. CRP elevated at 9.1. Myeloma panel unremarkable, total immunoglobulins normal, no monoclonal proteins on IFE, lyme negative. - Appreciate neurology recs - Defer LP given ongoing coagulopathy and hemolytic anemia - Replete vitamins B12, folate related to chronic alcohol abuse - Check MMA tomorrow - PT/OT eval and treat  Agitation, confusion - likely alcohol withdrawal, reports heavy daily alcohol use for years - CIWA protocol - Bedside sitter  FEN/GI: Regular diet, replete electrolytes as needed  DVT ppx: Lovenox  Dispo: Anticipated discharge in approximately 3-4 day(s).   LOS: 1 day   Althia FortsAdam Kobe Ofallon, MD 04/23/2016, 11:29 AM Pager: [phone]

## 2016-04-23 NOTE — Consult Note (Signed)
PULMONARY / CRITICAL CARE MEDICINE   Name: Ryan [name] MRN: [id number] DOB: 03/26/1971    ADMISSION DATE:  07-17-15 CONSULTATION DATE:  04/23/16  REFERRING MD:  Richardo PriestGranfortuna, J MD  CHIEF COMPLAINT: Altered mental status, hypoxia.  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 This weakness has been progressive over the past 6 months. [place] He has had workup including MRI, EMG, and extensive serologies sent.  Patient was at ultrasound today and he was noted to be hypoxic to the 30s when head of bed was lowered. He recovered quickly with supplemental oxygen. He also has worsening mental status, unresponsiveness. PCCM called to evaluate for intubation needs. He received 1 mg of Ativan earlier today and no other sedating medication. He received one dose of narcan without effect.   PAST MEDICAL HISTORY :  He  has a past medical history of Alcohol abuse and Hypertension.  PAST SURGICAL HISTORY: He  has no past surgical history on file.  No Known Allergies  No current facility-administered medications on file prior to encounter.    Current Outpatient Prescriptions on File Prior to Encounter  Medication Sig  . ALPRAZolam (XANAX) 0.5 MG tablet Take 0.5 mg by mouth 3 (three) times daily as needed for anxiety.   . gabapentin (NEURONTIN) 300 MG capsule Take 1 capsule (300 mg total) by mouth 3 (three) times daily.  Marland Kitchen. lisinopril (PRINIVIL,ZESTRIL) 40 MG tablet Take 40 mg by mouth daily.  Marland Kitchen. omeprazole (PRILOSEC) 40 MG capsule Take 40 mg by mouth daily as needed (for acid reflux).   . traMADol (ULTRAM) 50 MG tablet Take 1 tablet (50 mg total) by mouth every 6 (six) hours as needed. (Patient taking differently: Take 50 mg by mouth every 6 (six) hours as needed for moderate pain. )    FAMILY HISTORY:  His indicated that his mother is  alive. He indicated that his father is alive. He indicated that his sister is alive. He indicated that his brother is alive.    SOCIAL HISTORY: He  reports that he has never smoked. He has never used smokeless tobacco. He reports that he drinks about 46.2 oz of alcohol per week . He reports that he does not use drugs.  REVIEW OF SYSTEMS:   Unable to obtain as patient has altered mental status  SUBJECTIVE:    VITAL SIGNS: BP (!) 109/91 (BP Location: Right Arm)   Pulse 85   Temp 98.6 F (37 C) (Axillary)   Resp 19   Ht 5' 9.5" (1.765 m)   Wt 213 lb 6.5 oz (96.8 kg)   SpO2 95%   BMI 31.06 kg/m   HEMODYNAMICS:    VENTILATOR SETTINGS:    INTAKE / OUTPUT: I/O last 3 completed shifts: In: 5203.3 [P.O.:60; I.V.:2473.3; Blood:670; IV Piggyback:2000] Out: 625 [Urine:625]  PHYSICAL EXAMINATION: General: Sedated,  Neuro:  PERRL, responsive to painful stimuli HEENT:  Moist mucus membranes, No thyromegaly, JVD Cardiovascular:  RRR, No MRG Lungs:  Clear Abdomen:  Obese, distended, + BS Musculoskeletal:  Normal tone and bulk Skin:  Intact  LABS:  BMET  Recent Labs Lab 20-Nov-2015 1637 04/23/16 0100 04/23/16 0521  NA 127* 128* 129*  K 4.0 3.8 3.6  CL 88* 94* 95*  CO2 25 20* 22  BUN 36* 33* 31*  CREATININE 1.67* 1.48* 1.38*  GLUCOSE 121* 135* 139*    Electrolytes  Recent Labs Lab 20-Nov-2015  1637 04/23/16 0100 04/23/16 0521  CALCIUM 9.1 8.4* 8.5*    CBC  Recent Labs Lab 04/07/2016 2145 04/23/16 0521 04/23/16 0737  WBC 9.0 9.4 9.0  HGB 7.8* 7.7* 7.1*  HCT 21.5* 21.2* 19.6*  PLT 110* 110* 97*    Coag's  Recent Labs Lab 04/09/2016 1637 04/23/16 0737  APTT 43* 46*  INR 1.58 1.63    Sepsis Markers  Recent Labs Lab 04/08/2016 1652  LATICACIDVEN 1.99*    ABG  Recent Labs Lab 04/23/16 1736  PHART 7.366  PCO2ART 44.6  PO2ART 92.0    Liver Enzymes  Recent Labs Lab 04/21/2016 1637 04/23/16 0521  AST 121* 112*  ALT 52 50  ALKPHOS 55 52   BILITOT 3.3* 4.1*  ALBUMIN 3.3* 3.1*    Cardiac Enzymes No results for input(s): TROPONINI, PROBNP in the last 168 hours.  Glucose  Recent Labs Lab 04/23/16 0740  GLUCAP 138*    Imaging Dg Chest 2 View  Result Date: 04/23/2016 CLINICAL DATA:  Weakness.Patient was weak, lethargic, confused, pale in color. ED notes reports nausea, vomiting, bilateral leg weakness.HX HTN EXAM: CHEST  2 VIEW COMPARISON:  04/08/2016 FINDINGS: Cardiac silhouette is normal in size. No mediastinal or hilar masses. No convincing adenopathy. Clear lungs.  No pleural effusion or pneumothorax. Skeletal structures are intact. IMPRESSION: No active cardiopulmonary disease. Electronically Signed   By: Amie Portland M.D.   On: 04/23/2016 12:07   US Abdomen Complete  Result Date: 04/23/2016 CLINICAL DATA:  History jaundice and alcohol abuse. Elevated liver function tests. EXAM: ABDOMEN ULTRASOUND COMPLETE COMPARISON:  None. FINDINGS: Gallbladder: Gallbladder sludge. No wall thickening or pericholecystic fluid. Common bile duct: Diameter: Normal, 5 mm.  Not well visualized. Liver: Increased echogenicity. Mildly irregular hepatic capsule suspected, including on image 28. IVC: No abnormality visualized. Pancreas: Poorly visualized due to overlying bowel gas. Spleen: Size and appearance within normal limits. Right Kidney: Length: 10.7 cm. Echogenicity within normal limits. No mass or hydronephrosis visualized. Left Kidney: Length: 12.0 cm. Echogenicity within normal limits. No mass or hydronephrosis visualized. Abdominal aorta: Portions of the aorta visualized are unremarkable. The proximal and distal aorta are not well visualized. Other findings: No ascites. Exam degraded by patient clinical condition and overlying bowel gas. IMPRESSION: 1. Decreased sensitivity and specificity exam due to technique related factors, as described above. 2. Increased echogenicity of the liver suggests hepatic steatosis. Probable concurrent  cirrhosis. 3. Gallbladder sludge. Electronically Signed   By: Jeronimo Greaves M.D.   On: 04/23/2016 17:33   Dg Chest Port 1 View  Result Date: 04/23/2016 CLINICAL DATA:  Respiratory distress. EXAM: PORTABLE CHEST 1 VIEW COMPARISON:  PA and lateral chest 04/23/2016. Single-view of the chest 04/23/2015. FINDINGS: Lung volumes are lower than on the comparison studies. There is cardiomegaly and mild interstitial edema. Subsegmental atelectasis in the bases is noted. No pneumothorax or effusion. IMPRESSION: Cardiomegaly and mild interstitial edema. Bibasilar atelectasis in a low volume chest. Electronically Signed   By: Drusilla Kanner M.D.   On: 04/23/2016 18:02     STUDIES:  CXR length/19/17-mild interstitial edema, bibasilar atelectasis.   CULTURES:   ANTIBIOTICS:   SIGNIFICANT EVENTS:   LINES/TUBES:   DISCUSSION: Pt with subacute progressive axonal sensorimotor polyneuropathy, anemia, encephalopathy in setting of chronic alcohol use. The change in mental status may be due to the ativan he received earlier today in setting of hepatic encephalopathy. He may be more sensitive to sedating meds than usual. Extensive workup by neurology in process.  PCCM called for  evaluation of worsening mental status, hypoxia. On examination he appears stable with reassuring ABG and sats. He does not appear to be working hard to breathe and is protecting his airway. There is no acute need for intubation. We'll continue to monitor closely and moved to the ICU if there is any worsening of his respiratory status.  Reccs: - Continue monitoring in SDU - Low threshold to move to ICU - Avoid sedating meds.  - Continue lactulose per rectum if he is unable to take PO. - Work up for weakness, altered mental status as per primary team and neurology.  Chilton GreathousePraveen Wilbern Pennypacker MD [facility] Pager [phone] If no answer or after 3pm call: [phone] 04/23/2016, 7:48 PM

## 2016-04-23 NOTE — Care Management Note (Signed)
Case Management Note  Patient Details  Name: Ryan [name] MRN: [id number] Date of Birth: 08/04/1970  Subjective/Objective:                    Action/Plan: DX Subacute progressive axonal sensorimotor polyneuropathy  Will continue to follow recommend PT/OT evals and SW consult  Expected Discharge Date:                  Expected Discharge Plan:     In-House Referral:  Clinical Social Work  Discharge planning Services  CM Consult  Post Acute Care Choice:    Choice offered to:     DME Arranged:    DME Agency:     HH Arranged:    HH Agency:     Status of Service:  In process, will continue to follow  If discussed at Long Length of Stay Meetings, dates discussed:    Additional Comments:  Kingsley PlanWile, Ryan Craton Marie, RN 04/23/2016, 11:20 AM

## 2016-04-23 NOTE — Progress Notes (Signed)
CRITICAL VALUE ALERT  Critical value received:  hgb 6.5  Date of notification:  04/23/2016  Time of notification:  1930  Critical value read back:Yes.    Nurse who received alert:  Deneise LeverPatricia Dameian Crisman RN  MD notified (1st page):  TS on call Peggyann JubaO'Sullivan and DR. Mannam  Time of first page:  1930  MD notified (2nd page):  Time of second page:  Responding MD:  Dr. Isaiah SergeMannam and Dr. Peggyann Juba'Sullivan  Time MD responded:  [phone]

## 2016-04-23 NOTE — Progress Notes (Signed)
Dr. Laural BenesJohnson and Dr. Johnny BridgeSaraiya notified of pt intent to leave AMA.  Pt adamant that he wants to leave and go home for the night.  Both MDs at bedside to talk with pt.  Will continue to monitor pt closely.

## 2016-04-23 NOTE — Progress Notes (Signed)
I&O Catheter attempted twice; inable to pass. MD notified and requested coude I&o/foley.  Coude catheter inserted with 1,350 cc output

## 2016-04-23 NOTE — Progress Notes (Signed)
Nutrition Brief Note  Patient identified on the Malnutrition Screening Tool (MST) Report  Wt Readings from Last 15 Encounters:  04/23/16 213 lb 6.5 oz (96.8 kg)  03/12/16 226 lb 6.4 oz (102.7 kg)   This is a 45-yo man who [facility] complaining of weakness. He is a terrible historian who currently tells me that he is not having any weakness in his arms or legs despite obvious evidence to the contrary. He is encephalopathic. No family is present. History is therefore limited to the review of the patient's medical record, including outpatient neurology where he has been seen for this progressive weakness.   Pt admitted with acute encephalopathy.   Pt very disoriented at time of visit, questioning RN about location of cell phone. Unable to obtain nutrition hx at this time.   Breakfast tray on counter reveals 50-75% meal consumption.   No signs of fat or muscle depletion noted. Per wt hx records, noted a 5.8% wt loss which is trending toward significant. However, due to limited hx suspect that wt measurements may be inconsistent.   Body mass index is 31.06 kg/m. Patient meets criteria for obesity, class I based on current BMI.   Current diet order is Heart Healthy/ Carb Modifed, patient is consuming approximately 50% of meals at this time. Labs and medications reviewed.   No nutrition interventions warranted at this time. If nutrition issues arise, please consult RD.   Rinoa Garramone A. Mayford KnifeWilliams, RD, LDN, CDE Pager: [phone] After hours Pager: [phone]

## 2016-04-23 NOTE — ED Notes (Signed)
Pt still c/o needing to use the bathroom. Bladder scan completed showing >97699ml. Pt able to use the urinal in bed with 625ml output

## 2016-04-24 ENCOUNTER — Inpatient Hospital Stay (HOSPITAL_COMMUNITY): Payer: BLUE CROSS/BLUE SHIELD

## 2016-04-24 DIAGNOSIS — K7030 Alcoholic cirrhosis of liver without ascites: Secondary | ICD-10-CM

## 2016-04-24 DIAGNOSIS — D599 Acquired hemolytic anemia, unspecified: Secondary | ICD-10-CM

## 2016-04-24 DIAGNOSIS — G610 Guillain-Barre syndrome: Principal | ICD-10-CM

## 2016-04-24 DIAGNOSIS — K7200 Acute and subacute hepatic failure without coma: Secondary | ICD-10-CM

## 2016-04-24 DIAGNOSIS — E871 Hypo-osmolality and hyponatremia: Secondary | ICD-10-CM

## 2016-04-24 LAB — APTT
APTT: 42 s — AB (ref 24–36)
APTT: 38 s — AB (ref 24–36)

## 2016-04-24 LAB — POCT I-STAT 3, ART BLOOD GAS (G3+)
Acid-base deficit: 1 mmol/L (ref 0.0–2.0)
Bicarbonate: 24.6 mmol/L (ref 20.0–28.0)
O2 Saturation: 90 %
PCO2 ART: 43.7 mmHg (ref 32.0–48.0)
PO2 ART: 61 mmHg — AB (ref 83.0–108.0)
Patient temperature: 98.6
TCO2: 26 mmol/L (ref 0–100)
pH, Arterial: 7.358 (ref 7.350–7.450)

## 2016-04-24 LAB — BASIC METABOLIC PANEL
ANION GAP: 10 (ref 5–15)
BUN: 21 mg/dL — ABNORMAL HIGH (ref 6–20)
CALCIUM: 9 mg/dL (ref 8.9–10.3)
CHLORIDE: 100 mmol/L — AB (ref 101–111)
CO2: 23 mmol/L (ref 22–32)
CREATININE: 1.12 mg/dL (ref 0.61–1.24)
GFR calc non Af Amer: >60 mL/min (ref >60)
Glucose, Bld: 133 mg/dL — ABNORMAL HIGH (ref 65–99)
Potassium: 3.8 mmol/L (ref 3.5–5.1)
SODIUM: 133 mmol/L — AB (ref 135–145)

## 2016-04-24 LAB — CBC
HCT: 26.1 % — ABNORMAL LOW (ref 39.0–52.0)
HEMOGLOBIN: 9.2 g/dL — AB (ref 13.0–17.0)
MCH: 31.4 pg (ref 26.0–34.0)
MCHC: 35.2 g/dL (ref 30.0–36.0)
MCV: 89.1 fL (ref 78.0–100.0)
Platelets: 122 10*3/uL — ABNORMAL LOW (ref 150–400)
RBC: 2.93 MIL/uL — ABNORMAL LOW (ref 4.22–5.81)
RDW: 16.9 % — ABNORMAL HIGH (ref 11.5–15.5)
WBC: 12.4 10*3/uL — AB (ref 4.0–10.5)

## 2016-04-24 LAB — PROTIME-INR
INR: 1.44
INR: 1.39
PROTHROMBIN TIME: 17.7 s — AB (ref 11.4–15.2)
Prothrombin Time: 17.2 seconds — ABNORMAL HIGH (ref 11.4–15.2)

## 2016-04-24 LAB — HAPTOGLOBIN: HAPTOGLOBIN: 76 mg/dL (ref 34–200)

## 2016-04-24 LAB — GLUCOSE, CAPILLARY
GLUCOSE-CAPILLARY: 120 mg/dL — AB (ref 65–99)
Glucose-Capillary: 127 mg/dL — ABNORMAL HIGH (ref 65–99)

## 2016-04-24 MED ORDER — FUROSEMIDE 10 MG/ML IJ SOLN
20.0000 mg | Freq: Once | INTRAMUSCULAR | Status: AC
  Administered 2016-04-24: 20 mg via INTRAVENOUS

## 2016-04-24 MED ORDER — VITAMIN D 1000 UNITS PO TABS
1000.0000 [IU] | ORAL_TABLET | Freq: Every day | ORAL | Status: DC
  Administered 2016-04-24 – 2016-04-26 (×3): 1000 [IU] via ORAL
  Filled 2016-04-24 (×3): qty 1

## 2016-04-24 MED ORDER — LACTULOSE 10 GM/15ML PO SOLN
10.0000 g | ORAL | Status: DC
  Administered 2016-04-24 (×10): 10 g
  Filled 2016-04-24 (×5): qty 15

## 2016-04-24 MED ORDER — FUROSEMIDE 10 MG/ML IJ SOLN
INTRAMUSCULAR | Status: AC
  Filled 2016-04-24: qty 2

## 2016-04-24 MED ORDER — IOPAMIDOL (ISOVUE-300) INJECTION 61%
INTRAVENOUS | Status: AC
  Administered 2016-04-24: 100 mL
  Filled 2016-04-24: qty 100

## 2016-04-24 NOTE — Evaluation (Signed)
Physical Therapy Evaluation Patient Details Name: Ryan [name] MRN: [id number] DOB: 10/11/1970 Today's Date: 04/24/2016   History of Present Illness  Pt adm with progressive weakness and fount to have acute inflammatory demyelating polyradiculoneuropathy and Acute encephalopathy secondary to likely long term alcohol use and cirrhosis. PMH - etoh, htn  Clinical Impression  Pt admitted with above diagnosis and presents to PT with functional limitations due to deficits listed below (See PT problem list). Pt needs skilled PT to maximize independence and safety to allow discharge to possibly CIR for further rehab. Pt with severe mobility and cognitive limitations at this time. If arousal and cognition improve hopefully pt will make good progress.     Follow Up Recommendations CIR    Equipment Recommendations  Other (comment) (To be assessed)    Recommendations for Other Services       Precautions / Restrictions Precautions Precautions: Fall Restrictions Weight Bearing Restrictions: No      Mobility  Bed Mobility Overal bed mobility: Needs Assistance Bed Mobility: Supine to Sit;Sit to Supine     Supine to sit: +2 for physical assistance;Total assist Sit to supine: +2 for physical assistance;Total assist   General bed mobility comments: Assist for all aspects  Transfers                    Ambulation/Gait                Stairs            Wheelchair Mobility    Modified Rankin (Stroke Patients Only)       Balance Overall balance assessment: Needs assistance Sitting-balance support: Bilateral upper extremity supported;Feet supported Sitting balance-Leahy Scale: Zero Sitting balance - Comments: Sat EOB x 6 minutes with max A. No righting reactions. Postural control: Posterior lean;Right lateral lean;Left lateral lean                                   Pertinent Vitals/Pain Pain Assessment: Faces Faces Pain Scale: No hurt    Home  Living Family/patient expects to be discharged to:: Private residence                 Additional Comments: Unknown.     Prior Function Level of Independence: Independent         Comments: prior to onset of weakness     Hand Dominance   Dominant Hand: Right    Extremity/Trunk Assessment   Upper Extremity Assessment: Defer to OT evaluation           Lower Extremity Assessment: RLE deficits/detail;LLE deficits/detail RLE Deficits / Details: Difficult to formally assess due to lethargy. Pt able to move hip and knee against gravity but less than 3/5. LLE Deficits / Details: Difficult to formally assess due to lethargy. Pt able to move hip and knee against gravity but less than 3/5.     Communication   Communication: Other (comment) (mumble, difficult to understand)  Cognition Arousal/Alertness: Lethargic Behavior During Therapy: Flat affect Overall Cognitive Status: No family/caregiver present to determine baseline cognitive functioning Area of Impairment: Orientation;Attention;Following commands;Problem solving Orientation Level: Disoriented to;Place;Situation;Time Current Attention Level: Focused   Following Commands:  (Inconsistently follows some one step commands)       General Comments: Pt lethargic and only arouses briefly to stimuli    General Comments      Exercises     Assessment/Plan    PT Assessment  Patient needs continued PT services  PT Problem List Decreased strength;Decreased activity tolerance;Decreased balance;Decreased mobility;Decreased cognition;Decreased knowledge of use of DME          PT Treatment Interventions DME instruction;Gait training;Functional mobility training;Therapeutic activities;Therapeutic exercise;Balance training;Neuromuscular re-education;Cognitive remediation;Patient/family education    PT Goals (Current goals can be found in the Care Plan section)  Acute Rehab PT Goals Patient Stated Goal: Pt unable to  state PT Goal Formulation: Patient unable to participate in goal setting Time For Goal Achievement: 05/08/16 Potential to Achieve Goals: Fair    Frequency Min 3X/week   Barriers to discharge Decreased caregiver support Unknown support after dc    Co-evaluation               End of Session   Activity Tolerance: Patient limited by lethargy Patient left: in bed;with call bell/phone within reach;with bed alarm set           Time: 1105-1120 PT Time Calculation (min) (ACUTE ONLY): 15 min   Charges:   PT Evaluation $PT Eval Moderate Complexity: 1 Procedure     PT G CodesAngelina Ok:        Melia Hopes W [place] 04/24/2016, 2:08 PM [facility] PT [phone]

## 2016-04-24 NOTE — Progress Notes (Addendum)
Internal Medicine Night Float Interim Progress Note  S: Called by nursing at 2000 for change in patient's clinical status. Earlier in the day, patient was satting well on room air, somnolent, but would arouse to answer questions. This evening, patient began to desaturate into the upper 80s on 6 L [place], became tachypneic and increasingly tachycardic.   We promptly went to evaluate the patient and found him to be sitting up, but slouched in bed and minimally responsive to verbal and physical stimuli. Patient will minimally open eyes and grunt. He does not answer questions. He was tachynpeic at 25, tachycardic in the 110s, satting 92% on non-rebreather.  O: Vitals:   04/24/16 2000 04/24/16 2021  BP: (!) 144/93   Pulse: (!) 119 (!) 120  Resp: (!) 24 (!) 25  Temp:     Physical Exam General: Vital signs reviewed.  Patient is ill appearing, in acute distress and cooperative with exam.  Cardiovascular: Tachycardic, regular rhythm Pulmonary/Chest: Upper airway rhonchi, diffuse rhonchorus sounds, minimal inspiratory crackles in bases L > R.  Abdominal: Distended, tight and rigid, hypoactive bowel sounds, tympanic to percussion. No high-pitched tinkling sounds noted. No guarding present.  Extremities: Trace lower extremity edema bilaterally Neurological: Somnolent, lethargic, minimally arousable to voice and pain.   A/P:  Concern for SBO versus Ileus: On physical exam, patient was very distended with no audible bowels sounds and tympanic to percussion. In the setting of hypoxia, tachycardia and tachypnea, we worry about an intra-abdominal process such as SBO or Ileus. STAT Abdominal xray was obtained which revealed enteric tube noted coiling within the body of the stomach and diffusely distended loops of colon likely reflective of mild ileus. No free intra-abdominal air was seen. -STAT CT Abdomen/Pelvis with Contrast to better characterize ileus and rule out other intra-abdominal processes -STAT CBC,  CMET, and Lipase for electrolyte abnormalities, rising leukocytosis, recent anemia  -NGT in place, will pull back some  Hypoxia: Patient recurrently hypoxic to 80s on 6 L [place] now 92% on non-rebreather. Likely secondary to hypoexpansion of lungs from abdominal pressure versus pulmonary edema. CXR 1v portable showed hypoexpanded lungs, vascular congestion, increased Insterstitial markings concerning for mild interstitial edema and small left pleural effusion. Patient received Lasix 20 mg IV once. Doubt hypoxia is fully from positioning or lethargy as he is not have periods of apnea. ABG showed pH 7.35, pCO2 43.7, pO2 61. He has not received any recent sedating medications. -CT abdomen/pelvis as above to characterize intra-abdominal process -Supplemental O2 as needed -Low threshold to transfer to ICU if deteriorating   ADDENDUM: -Asked nursing to switch NGT to intermittent suction --> 800 cc out  Karlene LinemanAlexa Burns, DO PGY-3 Internal Medicine Resident Pager # [phone] 04/24/2016 9:53 PM

## 2016-04-24 NOTE — Progress Notes (Signed)
Subjective:  Yesterday afternoon, patient was wanting to leave, bugt was convinced to stay. He had signs of alcohol withdrawal including tachycardia, and confusion. CIWA protocol was ordered, as earlier he had admitted to drinking 6 beers daily. He had received a dose of ativan. Later on, while undergoing abd ultrasound, he was noted to be desatting to the 70s down to the 30s. He was put on [place] and returned back to room and was found to be paradoxically breathing and evaluated by the day team. Narcan was given without response. PCCM was consulted. ABG was obtained which was reassuring. At that point, he was satting well, and protecting his airway.  He was also eval by night team, and had urinary retention, and coude catheter was inserted. He was in NAD.  This morning, patient was arousable to voice, and he was answering direct questions. He was alert to his name, year, but not to place and kept saying he was in [place]. His vital signs were stable, and he was 100% O2 on [place]. Earlier a lactulose enema was given and he had a small bowel movement, but I explained to him that he will need an NG tube and hourly lactulose.    Objective: Vital signs in last 24 hours: Vitals:   04/24/16 0500 04/24/16 0700 04/24/16 0752 04/24/16 0800  BP:   140/73 137/82  Pulse:  (!) 101 99 99  Resp:  (!) 23 (!) 22 (!) 21  Temp:   98.2 F (36.8 C)   TempSrc:   Axillary   SpO2:  100% 100% 100%  Weight: 216 lb 0.8 oz (98 kg)     Height:        Intake/Output Summary (Last 24 hours) at 04/24/16 8768 Last data filed at 04/24/16 0600  Gross per 24 hour  Intake          2061.25 ml  Output             1750 ml  Net           311.25 ml    Physical Exam General appearance: Obese, tired-appearing, disheveled man laying in bed. He opens eyes to voice, and answers some of the questions. On nasal cannula  HENT: Normocephalic, atraumatic, moist/pale mucous membranes, neck supple Eyes:  EOM intact, mild  icterus Cardiovascular: Tachycardic rate and regular rhythm, no murmurs, rubs, gallops Respiratory: Clear to auscultation anteriorly.  Abdomen: Obese, BS+, soft, non-tender, distended Extremities: Normal bulk , no edema, 2+ peripheral pulses Skin: Warm, dry  Neuro: Awake and alert, but confused often in conversation, He was oriented to person and year, but not to place Patient did not participate in the neuro exam- particularly in strength and sensation.    Labs / Imaging / Procedures: CBC Latest Ref Rng & Units 04/24/2016 04/23/2016 04/23/2016  WBC 4.0 - 10.5 K/uL 12.4(H) 7.9 9.0  Hemoglobin 13.0 - 17.0 g/dL 9.2(L) 6.5(LL) 7.1(L)  Hematocrit 39.0 - 52.0 % 26.1(L) 18.1(L) 19.6(L)  Platelets 150 - 400 K/uL 122(L) 105(L) 97(L)   BMP Latest Ref Rng & Units 04/24/2016 04/23/2016 04/23/2016  Glucose 65 - 99 mg/dL 133(H) 139(H) 135(H)  BUN 6 - 20 mg/dL 21(H) 31(H) 33(H)  Creatinine 0.61 - 1.24 mg/dL 1.12 1.38(H) 1.48(H)  BUN/Creat Ratio 9 - 20 - - -  Sodium 135 - 145 mmol/L 133(L) 129(L) 128(L)  Potassium 3.5 - 5.1 mmol/L 3.8 3.6 3.8  Chloride 101 - 111 mmol/L 100(L) 95(L) 94(L)  CO2 22 - 32 mmol/L 23 22  20(L)  Calcium 8.9 - 10.3 mg/dL 9.0 8.5(L) 8.4(L)   Dg Chest 2 View  Result Date: 04/23/2016 CLINICAL DATA:  Weakness.Patient was weak, lethargic, confused, pale in color. ED notes reports nausea, vomiting, bilateral leg weakness.HX HTN EXAM: CHEST  2 VIEW COMPARISON:  05/01/2016 FINDINGS: Cardiac silhouette is normal in size. No mediastinal or hilar masses. No convincing adenopathy. Clear lungs.  No pleural effusion or pneumothorax. Skeletal structures are intact. IMPRESSION: No active cardiopulmonary disease. Electronically Signed   By: Lajean Manes M.D.   On: 04/23/2016 12:07   US Abdomen Complete  Result Date: 04/23/2016 CLINICAL DATA:  History jaundice and alcohol abuse. Elevated liver function tests. EXAM: ABDOMEN ULTRASOUND COMPLETE COMPARISON:  None. FINDINGS: Gallbladder:  Gallbladder sludge. No wall thickening or pericholecystic fluid. Common bile duct: Diameter: Normal, 5 mm.  Not well visualized. Liver: Increased echogenicity. Mildly irregular hepatic capsule suspected, including on image 28. IVC: No abnormality visualized. Pancreas: Poorly visualized due to overlying bowel gas. Spleen: Size and appearance within normal limits. Right Kidney: Length: 10.7 cm. Echogenicity within normal limits. No mass or hydronephrosis visualized. Left Kidney: Length: 12.0 cm. Echogenicity within normal limits. No mass or hydronephrosis visualized. Abdominal aorta: Portions of the aorta visualized are unremarkable. The proximal and distal aorta are not well visualized. Other findings: No ascites. Exam degraded by patient clinical condition and overlying bowel gas. IMPRESSION: 1. Decreased sensitivity and specificity exam due to technique related factors, as described above. 2. Increased echogenicity of the liver suggests hepatic steatosis. Probable concurrent cirrhosis. 3. Gallbladder sludge. Electronically Signed   By: Abigail Miyamoto M.D.   On: 04/23/2016 17:33   Dg Chest Port 1 View  Result Date: 04/23/2016 CLINICAL DATA:  Respiratory distress. EXAM: PORTABLE CHEST 1 VIEW COMPARISON:  PA and lateral chest 04/23/2016. Single-view of the chest 04/23/2015. FINDINGS: Lung volumes are lower than on the comparison studies. There is cardiomegaly and mild interstitial edema. Subsegmental atelectasis in the bases is noted. No pneumothorax or effusion. IMPRESSION: Cardiomegaly and mild interstitial edema. Bibasilar atelectasis in a low volume chest. Electronically Signed   By: Inge Rise M.D.   On: 04/23/2016 18:02    Assessment/Plan: Ryan [name] is a 45 y.o. man with PMH HTN and chronic alcohol abuse admitted for acute anemia and progressive weakness.  Acute encephalopathy secondary to likely long term alcohol use and cirrhosis: Patient was withdrawing from alcohol yesterday, and then  in the evening was desatting and mental status acutely worsened  This morning, he was responding to commands and answering some questions. We obtained CT head which does not show acute intracranial abnormality. A US abdomen was done which did not show ascites but did show increased echogenicity and likely cirrhosis.  His vital signs were stable this morning but patient was hypothermic to 95.5 earlier. Ammonia level elevated on admission. His white count has increased to 12.4 today from 7.9- there could very well be infection- may be SBP  -CIWA protocol, ativan if needed -Ordered NG tube for hourly lactulose, will decrease lactulose to every 3 hours once patient has a bowel movement for goal bowel movements of 3-4 per day  - lactulose as above -Obtained blood cultures x 2 -Obtained urine cultures -Patient may need an LP. Obtained coags and his INR is less than 1.5 -continue foley  -PCCM is aware of the patient   Anemia, hemolytic process likely related to chronic alcohol abuse vs vasculitic process, acute Hb 6.3 from 10.9 a few weeks ago, normal MCV, elevated TBili,  elevated LDH, negative DAT. Retics not adequately elevated at 1.6%. Improved to 7.8 s/p 2 units pRBCs. Previous lab workup revealed low folate, elevated B12, now with coagulopathy with elevated PT and aPTT. His ferritin was elevated at 2860. Acute hepatitis panel was negative. CK normal. TSH normal. Copper normal. ANA negative. HIV and RPR negative. Multiple myeloma panel normal. We are repleting folate, thiamine, vitamin K.  PBS revealed spherocytes, target cells, PMNs with toxic granulations and some hypersegmentation consistent with chronic alcohol abuse but some concern for potential underlying infection. On 11/17, he received another 2 units of PRBC. Platelets are stable.   -trend CBCs. Transfuse if HgB < 7 for goal HgB > 7 - Check ANCA titer - Mycoplasma IgM - Trend CBCs   Chronic hyponatremia Na of 123 on 10.26, now slowly  increasing and it is 133 today. Likely explained by chronic alcohol use. A CXR obtained earlier was unrevealing   --trend BMETs -replete IV fluids    Acute inflammatory demyelating polyradiculoneuropathy, progressive over 2 months, now has lost ability to walk and hand strength, EMG/NCS via Dr. Krista Blue, MRI brain, cervical, thoracic spine negative for abnormality. Planned for LP and considering IVIG. Elderly parents cannot physically take care of him at home as he has become dependent in ADLs is non-ambulatory. Previous workup per neurology: folate low <2, B12 high 1399, VitD low at 12, Ferritin elevated to 2800, hepatitis panel negative, CK normal, TSH normal, Copper normal, ANA, HIV, RPR negative. CRP elevated at 9.1. Myeloma panel unremarkable, total immunoglobulins normal, no monoclonal proteins on IFE, lyme negative.  - Appreciate neurology recs - Replete vitamins B12, folate related to chronic alcohol abuse - MMA pending  - PT/OT eval and treat   FEN/GI: NPO  DVT ppx: holding   Dispo: Anticipated discharge in approximately 3-4 day(s).   LOS: 2 days   Burgess Estelle, MD 04/24/2016, 9:09 AM Pager: [phone]

## 2016-04-24 NOTE — Progress Notes (Signed)
Neurology Progress Note  Subjective: The patient is somnolent and lethargic this morning. He will wake briefly to voice and mumbles a bit but then goes right back to sleep and does not participate with ROS or exam. I have reviewed his chart.   Yesterday afternoon he became insistent that he was going to leave and go home for the night but was convinced to stay by the primary team. Later in the afternoon he was noted to have oxygen desaturations as low as the 30s while in ultrasound. RN went to ultrasound to evaluate him and found him mouth breathing with shallow respirations. He was noted to be unresponsive to noxious stimulation. His oxygen was increased with improvement in saturation and responsiveness. PCCM consultation was requested following this episode. They felt that the episode may have been due to administration of Ativan earlier in the day compounded by hepatic encephalopathy. It appears that he has remained lethargic since this event.   Current Meds:   Current Facility-Administered Medications:  .  acetaminophen (TYLENOL) tablet 650 mg, 650 mg, Oral, Q6H PRN **OR** acetaminophen (TYLENOL) suppository 650 mg, 650 mg, Rectal, Q6H PRN, Deneise LeverParth Saraiya, MD .  ALPRAZolam Prudy Feeler(XANAX) tablet 0.5 mg, 0.5 mg, Oral, TID PRN, Deneise LeverParth Saraiya, MD .  Chlorhexidine Gluconate Cloth 2 % PADS 6 each, 6 each, Topical, Q0600, Deneise LeverParth Saraiya, MD, 6 each at 04/24/16 0554 .  folic acid injection 1 mg, 1 mg, Intravenous, Daily, Deneise LeverParth Saraiya, MD, 1 mg at 04/23/16 1304 .  gabapentin (NEURONTIN) capsule 300 mg, 300 mg, Oral, TID, Deneise LeverParth Saraiya, MD, 300 mg at 04/14/2016 2333 .  multivitamin with minerals tablet 1 tablet, 1 tablet, Oral, Daily, Deneise LeverParth Saraiya, MD .  mupirocin ointment (BACTROBAN) 2 % 1 application, 1 application, Nasal, BID, Deneise LeverParth Saraiya, MD, 1 application at 04/23/16 2123 .  ondansetron (ZOFRAN) tablet 4 mg, 4 mg, Oral, Q6H PRN **OR** ondansetron (ZOFRAN) injection 4 mg, 4 mg, Intravenous, Q6H PRN, Deneise LeverParth  Saraiya, MD .  pantoprazole (PROTONIX) injection 40 mg, 40 mg, Intravenous, Daily, Earnie LarssonFrank R Wilson, RPH, 40 mg at 04/23/16 1812 .  phytonadione (VITAMIN K) 5 mg in dextrose 5 % 50 mL IVPB, 5 mg, Intravenous, Once, Althia FortsAdam Johnson, MD .  senna-docusate (Senokot-S) tablet 1 tablet, 1 tablet, Oral, QHS PRN, Deneise LeverParth Saraiya, MD .  sodium chloride flush (NS) 0.9 % injection 3 mL, 3 mL, Intravenous, Q12H, Deneise LeverParth Saraiya, MD, 3 mL at 04/23/16 2123 .  thiamine (B-1) injection 100 mg, 100 mg, Intravenous, Daily, Deneise LeverParth Saraiya, MD, 100 mg at 04/23/16 1302  Objective:  Temp:  [95.9 F (35.5 C)-98.6 F (37 C)] 98.2 F (36.8 C) (11/18 0752) Pulse Rate:  [83-112] 99 (11/18 0752) Resp:  [13-26] 22 (11/18 0752) BP: (93-156)/(53-103) 140/73 (11/18 0752) SpO2:  [49 %-100 %] 100 % (11/18 0752) Weight:  [98 kg (216 lb 0.8 oz)] 98 kg (216 lb 0.8 oz) (11/18 0500)  General: WD ill-appearing jaundiced male lying in bed. He is in NAD. He is sleeping but will open his eyes when I call his name. He mumbles a response when I ask how he is doing but then goes back to sleep. He is not able to stay awake long enough to participate with the exam which is therefore limited.   HEENT: Neck is supple without lymphadenopathy. Sclerae are icteric. There is no conjunctival injection.  CV: Regular, no murmur. Carotid pulses are 2+ and symmetric with no bruits. Distal pulses 2+ and symmetric.  Lungs: CTAB  Extremities: No C/C/E. Neuro: MS:  As noted above.  CN: Pupils are equal and reactive from 3-->2 mm bilaterally. Eyes are conjugate. He has intact corneals. His face is grossly symmetric at rest and he has a symmetric grimace. The remainder of his cranial nerves cannot be accurately assessed as he does not participate with the exam.  Motor: Normal bulk. Tone is diminished throughout. He does not participate with strength testing due to lethargy. No tremor or other abnormal movements are observed.  Sensation: He grimaces and withdraws  from light nailbed pressure x4.  DTRs:.2+ biceps, absent otherwise. Toes are downgoing bilaterally. No pathological reflexes.  Coordination and gait: These cannot be assessed as the patient is unable to participate.   Labs: Lab Results  Component Value Date   WBC 12.4 (H) 04/24/2016   HGB 9.2 (L) 04/24/2016   HCT 26.1 (L) 04/24/2016   PLT 122 (L) 04/24/2016   GLUCOSE 133 (H) 04/24/2016   ALT 50 04/23/2016   AST 112 (H) 04/23/2016   NA 133 (L) 04/24/2016   K 3.8 04/24/2016   CL 100 (L) 04/24/2016   CREATININE 1.12 04/24/2016   BUN 21 (H) 04/24/2016   CO2 23 04/24/2016   TSH 3.810 04/01/2016   INR 1.44 04/24/2016   CBC Latest Ref Rng & Units 04/24/2016 04/23/2016 04/23/2016  WBC 4.0 - 10.5 K/uL 12.4(H) 7.9 9.0  Hemoglobin 13.0 - 17.0 g/dL 1.6(X9.2(L) 6.5(LL) 7.1(L)  Hematocrit 39.0 - 52.0 % 26.1(L) 18.1(L) 19.6(L)  Platelets 150 - 400 K/uL 122(L) 105(L) 97(L)    No results found for: HGBA1C Lab Results  Component Value Date   ALT 50 04/23/2016   AST 112 (H) 04/23/2016   ALKPHOS 52 04/23/2016   BILITOT 4.1 (H) 04/23/2016    Radiology:  No new neuroimaging for review.    A/P:   1. Subacute progressive axonal sensorimotor polyneuropathy: This involves the upper and lower extremities in a symmetric fashion with sparing of the cranial nerves and axial muscles. Differential extensive and workup as previously noted. LP will be deferred given coagulopathy.   2. Encephalopathy: He is more lethargic today after his hypoxic episode yesterday afternoon. Agree with CTH, may need to consider MRI if CTH negative and this degree of lethargy persists. Minimize CNS active meds, especially opiates, anything with strong anticholinergic properties, and benzos (unless needed for withdrawal management).   3. Generalized weakness: This appears significant and is due to his neuropathy. PT/OT/rehab.   4. Dysesthesias: These are due to his neuropathy. Continue gabapentin.  5. Abnormality of  gait: This is the result of his weakness. PT/rehab.   Rhona Leavensimothy Oster, MD [facility]

## 2016-04-24 NOTE — Progress Notes (Signed)
Rehab Admissions Coordinator Note:  Patient was screened by Clois DupesBoyette, Kabrina Christiano Godwin for appropriateness for an Inpatient Acute Rehab Consult.  At this time, we are recommending an inpt rehab consult.  Clois DupesBoyette, Caelyn Route Godwin 04/24/2016, 3:51 PM  I can be reached at [phone].

## 2016-04-25 ENCOUNTER — Inpatient Hospital Stay (HOSPITAL_COMMUNITY): Payer: BLUE CROSS/BLUE SHIELD

## 2016-04-25 DIAGNOSIS — J9601 Acute respiratory failure with hypoxia: Secondary | ICD-10-CM

## 2016-04-25 DIAGNOSIS — F1020 Alcohol dependence, uncomplicated: Secondary | ICD-10-CM

## 2016-04-25 DIAGNOSIS — Z8249 Family history of ischemic heart disease and other diseases of the circulatory system: Secondary | ICD-10-CM

## 2016-04-25 DIAGNOSIS — Z9911 Dependence on respirator [ventilator] status: Secondary | ICD-10-CM

## 2016-04-25 DIAGNOSIS — A4101 Sepsis due to Methicillin susceptible Staphylococcus aureus: Secondary | ICD-10-CM

## 2016-04-25 DIAGNOSIS — R579 Shock, unspecified: Secondary | ICD-10-CM

## 2016-04-25 DIAGNOSIS — E872 Acidosis, unspecified: Secondary | ICD-10-CM

## 2016-04-25 DIAGNOSIS — K7460 Unspecified cirrhosis of liver: Secondary | ICD-10-CM

## 2016-04-25 DIAGNOSIS — G629 Polyneuropathy, unspecified: Secondary | ICD-10-CM

## 2016-04-25 DIAGNOSIS — K5939 Other megacolon: Secondary | ICD-10-CM

## 2016-04-25 DIAGNOSIS — R6521 Severe sepsis with septic shock: Secondary | ICD-10-CM

## 2016-04-25 LAB — TYPE AND SCREEN
ABO/RH(D): A POS
Antibody Screen: NEGATIVE
UNIT DIVISION: 0
UNIT DIVISION: 0
Unit division: 0
Unit division: 0

## 2016-04-25 LAB — CBC
HEMATOCRIT: 30.3 % — AB (ref 39.0–52.0)
HEMOGLOBIN: 10.5 g/dL — AB (ref 13.0–17.0)
MCH: 31.9 pg (ref 26.0–34.0)
MCHC: 34.7 g/dL (ref 30.0–36.0)
MCV: 92.1 fL (ref 78.0–100.0)
Platelets: 188 10*3/uL (ref 150–400)
RBC: 3.29 MIL/uL — ABNORMAL LOW (ref 4.22–5.81)
RDW: 18 % — ABNORMAL HIGH (ref 11.5–15.5)
WBC: 18.9 10*3/uL — ABNORMAL HIGH (ref 4.0–10.5)

## 2016-04-25 LAB — DIFFERENTIAL
BASOS ABS: 0 10*3/uL (ref 0.0–0.1)
Basophils Relative: 0 %
EOS ABS: 0.1 10*3/uL (ref 0.0–0.7)
Eosinophils Relative: 0 %
LYMPHS ABS: 1 10*3/uL (ref 0.7–4.0)
Lymphocytes Relative: 5 %
Monocytes Absolute: 0.6 10*3/uL (ref 0.1–1.0)
Monocytes Relative: 3 %
Neutro Abs: 17.6 10*3/uL — ABNORMAL HIGH (ref 1.7–7.7)
Neutrophils Relative %: 92 %
WBC Morphology: INCREASED

## 2016-04-25 LAB — POCT I-STAT 3, ART BLOOD GAS (G3+)
ACID-BASE DEFICIT: 4 mmol/L — AB (ref 0.0–2.0)
ACID-BASE DEFICIT: 11 mmol/L — AB (ref 0.0–2.0)
ACID-BASE DEFICIT: 8 mmol/L — AB (ref 0.0–2.0)
BICARBONATE: 15.5 mmol/L — AB (ref 20.0–28.0)
Bicarbonate: 19.6 mmol/L — ABNORMAL LOW (ref 20.0–28.0)
Bicarbonate: 16.6 mmol/L — ABNORMAL LOW (ref 20.0–28.0)
O2 SAT: 85 %
O2 SAT: 96 %
O2 Saturation: 95 %
PCO2 ART: 30 mmHg — AB (ref 32.0–48.0)
PCO2 ART: 33 mmHg (ref 32.0–48.0)
PH ART: 7.424 (ref 7.350–7.450)
PO2 ART: 72 mmHg — AB (ref 83.0–108.0)
PO2 ART: 66 mmHg — AB (ref 83.0–108.0)
Patient temperature: 98.9
TCO2: 20 mmol/L (ref 0–100)
TCO2: 16 mmol/L (ref 0–100)
TCO2: 18 mmol/L (ref 0–100)
pCO2 arterial: 37.5 mmHg (ref 32.0–48.0)
pH, Arterial: 7.235 — ABNORMAL LOW (ref 7.350–7.450)
pH, Arterial: 7.319 — ABNORMAL LOW (ref 7.350–7.450)
pO2, Arterial: 93 mmHg (ref 83.0–108.0)

## 2016-04-25 LAB — URINE CULTURE: CULTURE: NO GROWTH

## 2016-04-25 LAB — COMPREHENSIVE METABOLIC PANEL
ALBUMIN: 3 g/dL — AB (ref 3.5–5.0)
ALK PHOS: 65 U/L (ref 38–126)
ALT: 54 U/L (ref 17–63)
ANION GAP: 12 (ref 5–15)
AST: 144 U/L — AB (ref 15–41)
BILIRUBIN TOTAL: 5.7 mg/dL — AB (ref 0.3–1.2)
BUN: 25 mg/dL — AB (ref 6–20)
CALCIUM: 9.4 mg/dL (ref 8.9–10.3)
CO2: 22 mmol/L (ref 22–32)
Chloride: 102 mmol/L (ref 101–111)
Creatinine, Ser: 1.51 mg/dL — ABNORMAL HIGH (ref 0.61–1.24)
GFR calc Af Amer: >60 mL/min (ref >60)
GFR, EST NON AFRICAN AMERICAN: 54 mL/min — AB (ref >60)
GLUCOSE: 120 mg/dL — AB (ref 65–99)
Potassium: 4.3 mmol/L (ref 3.5–5.1)
Sodium: 136 mmol/L (ref 135–145)
TOTAL PROTEIN: 5.8 g/dL — AB (ref 6.5–8.1)

## 2016-04-25 LAB — BLOOD CULTURE ID PANEL (REFLEXED)
Acinetobacter baumannii: NOT DETECTED
CANDIDA TROPICALIS: NOT DETECTED
Candida albicans: NOT DETECTED
Candida glabrata: NOT DETECTED
Candida krusei: NOT DETECTED
Candida parapsilosis: NOT DETECTED
ENTEROBACTERIACEAE SPECIES: NOT DETECTED
ENTEROCOCCUS SPECIES: NOT DETECTED
Enterobacter cloacae complex: NOT DETECTED
Escherichia coli: NOT DETECTED
HAEMOPHILUS INFLUENZAE: NOT DETECTED
KLEBSIELLA PNEUMONIAE: NOT DETECTED
Klebsiella oxytoca: NOT DETECTED
LISTERIA MONOCYTOGENES: NOT DETECTED
METHICILLIN RESISTANCE: NOT DETECTED
NEISSERIA MENINGITIDIS: NOT DETECTED
PROTEUS SPECIES: NOT DETECTED
Pseudomonas aeruginosa: NOT DETECTED
STAPHYLOCOCCUS SPECIES: DETECTED — AB
STREPTOCOCCUS SPECIES: NOT DETECTED
Serratia marcescens: NOT DETECTED
Staphylococcus aureus (BCID): DETECTED — AB
Streptococcus agalactiae: NOT DETECTED
Streptococcus pneumoniae: NOT DETECTED
Streptococcus pyogenes: NOT DETECTED

## 2016-04-25 LAB — PROCALCITONIN: Procalcitonin: 0.88 ng/mL

## 2016-04-25 LAB — BLOOD GAS, ARTERIAL
ACID-BASE DEFICIT: 5.2 mmol/L — AB (ref 0.0–2.0)
Bicarbonate: 18.9 mmol/L — ABNORMAL LOW (ref 20.0–28.0)
DRAWN BY: 100061
FIO2: 100
MECHVT: 580 mL
O2 Saturation: 94.6 %
PEEP/CPAP: 5 cmH2O
PH ART: 7.385 (ref 7.350–7.450)
PO2 ART: 79.3 mmHg — AB (ref 83.0–108.0)
Patient temperature: 98.6
RATE: 18 resp/min
pCO2 arterial: 32.3 mmHg (ref 32.0–48.0)

## 2016-04-25 LAB — MAGNESIUM: Magnesium: 2 mg/dL (ref 1.7–2.4)

## 2016-04-25 LAB — PROTIME-INR
INR: 1.44
Prothrombin Time: 17.6 seconds — ABNORMAL HIGH (ref 11.4–15.2)

## 2016-04-25 LAB — LACTIC ACID, PLASMA
LACTIC ACID, VENOUS: 7.1 mmol/L — AB (ref 0.5–1.9)
Lactic Acid, Venous: 5.2 mmol/L (ref 0.5–1.9)

## 2016-04-25 LAB — LIPASE, BLOOD: LIPASE: 33 U/L (ref 11–51)

## 2016-04-25 MED ORDER — ORAL CARE MOUTH RINSE
15.0000 mL | Freq: Four times a day (QID) | OROMUCOSAL | Status: DC
  Administered 2016-04-25 (×2): 15 mL via OROMUCOSAL

## 2016-04-25 MED ORDER — MIDAZOLAM HCL 2 MG/2ML IJ SOLN
INTRAMUSCULAR | Status: AC
  Filled 2016-04-25: qty 2

## 2016-04-25 MED ORDER — FOLIC ACID 1 MG PO TABS
1.0000 mg | ORAL_TABLET | Freq: Every day | ORAL | Status: DC
  Administered 2016-04-26: 1 mg via ORAL
  Filled 2016-04-25: qty 1

## 2016-04-25 MED ORDER — SODIUM CHLORIDE 0.9% FLUSH
10.0000 mL | Freq: Two times a day (BID) | INTRAVENOUS | Status: DC
  Administered 2016-04-25 – 2016-04-26 (×3): 10 mL

## 2016-04-25 MED ORDER — FENTANYL CITRATE (PF) 100 MCG/2ML IJ SOLN: 100.0000 ug | INTRAMUSCULAR | Status: DC | PRN

## 2016-04-25 MED ORDER — CHLORHEXIDINE GLUCONATE 0.12% ORAL RINSE (MEDLINE KIT)
15.0000 mL | Freq: Two times a day (BID) | OROMUCOSAL | Status: DC
  Administered 2016-04-25 – 2016-04-26 (×3): 15 mL via OROMUCOSAL

## 2016-04-25 MED ORDER — NOREPINEPHRINE BITARTRATE 1 MG/ML IV SOLN
0.0000 ug/min | INTRAVENOUS | Status: DC
  Administered 2016-04-25: 22 ug/min via INTRAVENOUS
  Administered 2016-04-25: 11 ug/min via INTRAVENOUS
  Administered 2016-04-25: 10 ug/min via INTRAVENOUS
  Administered 2016-04-26: 40 ug/min via INTRAVENOUS
  Administered 2016-04-26: 32 ug/min via INTRAVENOUS
  Administered 2016-04-26: 40 ug/min via INTRAVENOUS
  Filled 2016-04-25 (×6): qty 4

## 2016-04-25 MED ORDER — SODIUM CHLORIDE 0.9% FLUSH
10.0000 mL | INTRAVENOUS | Status: DC | PRN
  Administered 2016-04-25: 20 mL
  Filled 2016-04-25: qty 40

## 2016-04-25 MED ORDER — SODIUM CHLORIDE 0.9 % IV BOLUS (SEPSIS)
1000.0000 mL | Freq: Once | INTRAVENOUS | Status: AC
  Administered 2016-04-25: 1000 mL via INTRAVENOUS

## 2016-04-25 MED ORDER — POTASSIUM CHLORIDE IN NACL 20-0.9 MEQ/L-% IV SOLN
INTRAVENOUS | Status: DC
  Administered 2016-04-25: 100 mL via INTRAVENOUS
  Filled 2016-04-25: qty 1000

## 2016-04-25 MED ORDER — CHLORHEXIDINE GLUCONATE 0.12% ORAL RINSE (MEDLINE KIT)
15.0000 mL | Freq: Two times a day (BID) | OROMUCOSAL | Status: DC
  Administered 2016-04-25 (×2): 15 mL via OROMUCOSAL

## 2016-04-25 MED ORDER — MIDAZOLAM HCL 2 MG/2ML IJ SOLN
1.0000 mg | Freq: Once | INTRAMUSCULAR | Status: AC
Start: 2016-04-25 — End: 2016-04-25
  Administered 2016-04-25: 1 mg via INTRAVENOUS

## 2016-04-25 MED ORDER — SODIUM BICARBONATE 8.4 % IV SOLN
INTRAVENOUS | Status: AC
  Filled 2016-04-25: qty 50

## 2016-04-25 MED ORDER — PIPERACILLIN-TAZOBACTAM 3.375 G IVPB 30 MIN
3.3750 g | Freq: Once | INTRAVENOUS | Status: AC
  Administered 2016-04-25: 3.375 g via INTRAVENOUS
  Filled 2016-04-25: qty 50

## 2016-04-25 MED ORDER — SODIUM CHLORIDE 0.9 % IV BOLUS (SEPSIS)
500.0000 mL | Freq: Once | INTRAVENOUS | Status: AC
  Administered 2016-04-25: 500 mL via INTRAVENOUS

## 2016-04-25 MED ORDER — IBUPROFEN 100 MG/5ML PO SUSP
400.0000 mg | Freq: Once | ORAL | Status: AC
  Administered 2016-04-25: 400 mg
  Filled 2016-04-25: qty 20

## 2016-04-25 MED ORDER — VITAMIN B-1 100 MG PO TABS
100.0000 mg | ORAL_TABLET | Freq: Every day | ORAL | Status: DC
  Administered 2016-04-26: 100 mg via ORAL
  Filled 2016-04-25: qty 1

## 2016-04-25 MED ORDER — PIPERACILLIN-TAZOBACTAM 3.375 G IVPB
3.3750 g | Freq: Three times a day (TID) | INTRAVENOUS | Status: DC
Start: 2016-04-25 — End: 2016-04-27
  Administered 2016-04-25 – 2016-04-26 (×5): 3.375 g via INTRAVENOUS
  Filled 2016-04-25 (×7): qty 50

## 2016-04-25 MED ORDER — FENTANYL CITRATE (PF) 100 MCG/2ML IJ SOLN
25.0000 ug | INTRAMUSCULAR | Status: DC | PRN
  Administered 2016-04-25 (×2): 50 ug via INTRAVENOUS
  Filled 2016-04-25: qty 2

## 2016-04-25 MED ORDER — VANCOMYCIN HCL 10 G IV SOLR
1250.0000 mg | Freq: Two times a day (BID) | INTRAVENOUS | Status: DC
  Administered 2016-04-25: 1250 mg via INTRAVENOUS
  Filled 2016-04-25 (×2): qty 1250

## 2016-04-25 MED ORDER — ORAL CARE MOUTH RINSE
15.0000 mL | OROMUCOSAL | Status: DC
  Administered 2016-04-26 – 2016-04-27 (×11): 15 mL via OROMUCOSAL

## 2016-04-25 MED ORDER — DEXTROSE-NACL 5-0.45 % IV SOLN
INTRAVENOUS | Status: DC
  Administered 2016-04-25 – 2016-04-26 (×2): via INTRAVENOUS

## 2016-04-25 MED ORDER — ETOMIDATE 2 MG/ML IV SOLN
20.0000 mg | Freq: Once | INTRAVENOUS | Status: AC
  Administered 2016-04-25: 20 mg via INTRAVENOUS

## 2016-04-25 MED ORDER — FENTANYL CITRATE (PF) 100 MCG/2ML IJ SOLN
INTRAMUSCULAR | Status: AC
  Administered 2016-04-25: 100 ug
  Filled 2016-04-25: qty 2

## 2016-04-25 MED ORDER — DEXTROSE 5 % IV SOLN
0.0000 ug/min | INTRAVENOUS | Status: AC
  Administered 2016-04-25: 5 ug/min via INTRAVENOUS
  Filled 2016-04-25: qty 4

## 2016-04-25 MED ORDER — FENTANYL CITRATE (PF) 100 MCG/2ML IJ SOLN
100.0000 ug | INTRAMUSCULAR | Status: DC | PRN
Start: 2016-04-25 — End: 2016-04-25

## 2016-04-25 MED ORDER — VANCOMYCIN HCL 10 G IV SOLR
1250.0000 mg | Freq: Once | INTRAVENOUS | Status: AC
  Administered 2016-04-25: 1250 mg via INTRAVENOUS
  Filled 2016-04-25: qty 1250

## 2016-04-25 NOTE — Progress Notes (Signed)
Md made aware of pt's vital signs hr 109, sats 99% NRB, rr 29, bp 83/52 (62).  ? Lactic acid, procalcitonin.   WBC 18.9.  No new orders at this time.  Will continue to monitor Karena AddisonCoro, Marieelena Bartko T

## 2016-04-25 NOTE — Procedures (Signed)
Intubation Procedure Note Maggie FontJoseph Eagleton [id number] 08/20/1970  Procedure: Intubation Indications: Airway protection and maintenance, Hypoxemia  Procedure Details Consent: Risks of procedure as well as the alternatives and risks of each were explained to the (patient/caregiver).  Consent for procedure obtained. Time Out: Verified patient identification, verified procedure, site/side was marked, verified correct patient position, special equipment/implants available, medications/allergies/relevent history reviewed, required imaging and test results available.  Performed  Maximum sterile technique was used including gloves, gown, hand hygiene and mask.  Glidescope 4  Preoxygenation via BVM assisted ventilation 1mg  versed, 50mcg fentanyl 20mg  etomidate Glidescope 4 with grade IIa view of cords 8.0 ETT visualized passing between the cords to a depth of 24cm at the teeth +color change +tube condensation +chest rise +bilateral breath sounds  CXR pending   Evaluation Hemodynamic Status: BP stable throughout; O2 sats: stable throughout Patient's Current Condition: stable Complications: No apparent complications Patient did tolerate procedure well. Chest X-ray ordered to verify placement.  CXR: pending.   Nita SickleSarah Ellen E. Special Ranes, MD Pulmonary and Critical Care 04/25/16 5:57 AM

## 2016-04-25 NOTE — Progress Notes (Signed)
PHARMACY - PHYSICIAN COMMUNICATION CRITICAL VALUE ALERT - BLOOD CULTURE IDENTIFICATION (BCID)  Results for orders placed or performed during the hospital encounter of 2016/04/24  Blood Culture ID Panel (Reflexed) (Collected: 04/24/2016 10:45 AM)  Result Value Ref Range   Enterococcus species NOT DETECTED NOT DETECTED   Listeria monocytogenes NOT DETECTED NOT DETECTED   Staphylococcus species DETECTED (A) NOT DETECTED   Staphylococcus aureus DETECTED (A) NOT DETECTED   Methicillin resistance NOT DETECTED NOT DETECTED   Streptococcus species NOT DETECTED NOT DETECTED   Streptococcus agalactiae NOT DETECTED NOT DETECTED   Streptococcus pneumoniae NOT DETECTED NOT DETECTED   Streptococcus pyogenes NOT DETECTED NOT DETECTED   Acinetobacter baumannii NOT DETECTED NOT DETECTED   Enterobacteriaceae species NOT DETECTED NOT DETECTED   Enterobacter cloacae complex NOT DETECTED NOT DETECTED   Escherichia coli NOT DETECTED NOT DETECTED   Klebsiella oxytoca NOT DETECTED NOT DETECTED   Klebsiella pneumoniae NOT DETECTED NOT DETECTED   Proteus species NOT DETECTED NOT DETECTED   Serratia marcescens NOT DETECTED NOT DETECTED   Haemophilus influenzae NOT DETECTED NOT DETECTED   Neisseria meningitidis NOT DETECTED NOT DETECTED   Pseudomonas aeruginosa NOT DETECTED NOT DETECTED   Candida albicans NOT DETECTED NOT DETECTED   Candida glabrata NOT DETECTED NOT DETECTED   Candida krusei NOT DETECTED NOT DETECTED   Candida parapsilosis NOT DETECTED NOT DETECTED   Candida tropicalis NOT DETECTED NOT DETECTED     Changes to prescribed antibiotics required: Continue vancomycin and zosyn    Baldemar FridayMasters, Darion Milewski M 04/25/2016  2:03 PM

## 2016-04-25 NOTE — Progress Notes (Signed)
eLink Physician-Brief Progress Note Patient Name: Maggie FontJoseph Vassey DOB: 12/14/1970 MRN: [id number]   Date of Service  04/25/2016  HPI/Events of Note  Shock persistent, oliguric, titrating up pressors, significant edema on exam through camera check, known baseline cirrhosis RN unclear if cuff pressure accurate Respiratory unsuccessful with arterial line  eICU Interventions  Levophed titrated to MAP > 65 Check lactic acid now Will adjust/add vasopressin depending on lactic acid result; may need femoral arterial line      Intervention Category Major Interventions: Shock - evaluation and management  Max FickleDouglas Teja Costen 04/25/2016, 11:41 PM

## 2016-04-25 NOTE — Progress Notes (Signed)
eLink Physician-Brief Progress Note Patient Name: Ryan FontJoseph [name] DOB: 01/22/1971 MRN: [id number]   Date of Service  04/25/2016  HPI/Events of Note  Multiple issues: 1. Fever to 102.3 F. Already on Vancomycin and Zosyn. and 2. Respiratory Distress - ABG = 7.23/37.5/66/15.5. Suspect that respiratory distress is at least in part d/t his fever.   eICU Interventions  Will order: 1. Motrin 400 mg per tube X 1 now.  2. Increase PRVC rate to 30.  3. ABG at 7:30 PM. 4. Cooling blanket PRN.      Intervention Category Major Interventions: Infection - evaluation and management Intermediate Interventions: Respiratory distress - evaluation and management  [name] 04/25/2016, 6:30 PM

## 2016-04-25 NOTE — Progress Notes (Signed)
Md notified of pt's decreased LOC. sbp 80's.  Iv issues attempted iv restart.  Iv team notified.  ABG obtained  PH 7.4, PCo2 30.0, Po2 72.0, bicarb 19.6 , 95%.  Iv obtained and restarted 500 ns bolus.  Md to bedside.  Will continue to monitor. Ryan [name], Tag Wurtz T

## 2016-04-25 NOTE — Progress Notes (Signed)
PULMONARY / CRITICAL CARE MEDICINE   Name: Ryan FontJoseph [name] MRN: [id number] DOB: 09/15/1970    ADMISSION DATE:  05/05/2016 CONSULTATION DATE:  04/23/16  REFERRING MD:  Richardo PriestGranfortuna, J MD  CHIEF COMPLAINT: Altered mental status, hypoxia.  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 This weakness has been progressive over the past 6 months. [place] He has had workup including MRI, EMG, and extensive serologies sent. He experienced a desaturation event on 11/17 and was evaluated by PCCM. At that time it was felt he was stable with supplemental oxygen and able to protect his airway. Since then, he has developed an ileus with 1400cc out of an NGT, his work of breathing has increase and his hypoxemia has worsened to the point that his pO2 is 71 on NRB. He is minimally responsive to noxious stimuli and I doubt his ability to protect his airway. He has also become somewhat hypotensive with systolics in the 80s (prior baseline was 110s to 130s). He is unable to answer any questions for me and only grimaces to noxious stimuli. GCS 7 (1, 1, 5)  04/25/2016 - dad gave written hx of constipation x 3 weeks and 1-2 weeks of pain in lower extremities and hallucination and delusion prior to admission associated with confusion, falls and inability to feed. Also reported inability to urinate since pm of 04/14/2016 prior to admit  EVENTS 04/16/2016  - admit under IMTS. AMMONIA 76 11/17 - pccm consult - Development of ileus, worsening mental status, worsening hypoxemia 04/25/16 - move to ICU and intubatd and CVL placed. PCCM primary  SUBJECTIVE:  04/26/16 - now intubated. Parents and sister at bedside. Lot of questions - Very concerned about ileus and delirium. Want detailed family meet. On levophed, vanc, zosyun.  Lactate up a 5  RN reportes RASS -4 without sedation gtt or prn (per RN  2d was talking and yesteday was arousable earlier but progressively got worse with obtundation). Total CNS meds received since admit: gabapentin 300mg  tid x 2 days, versed 1mg  for tinbuation, narcan on 11/17 , ativan 1mg  11/17. No xanax given  VITAL SIGNS: BP (!) 87/48   Pulse (!) 113   Temp 98.6 F (37 C) (Oral)   Resp (!) 30   Ht 5' 9.5" (1.765 m)   Wt 95.3 kg (210 lb 1.6 oz)   SpO2 96%   BMI 30.58 kg/m   HEMODYNAMICS: CVP:  [9 mmHg] 9 mmHg  VENTILATOR SETTINGS: Vent Mode: PRVC FiO2 (%):  [80 %-100 %] 80 % Set Rate:  [18 bmp] 18 bmp Vt Set:  [580 mL] 580 mL PEEP:  [5 cmH20] 5 cmH20 Plateau Pressure:  [22 cmH20-27 cmH20] 27 cmH20  INTAKE / OUTPUT: I/O last 3 completed shifts: In: 1463.3 [I.V.:298.3; Blood:765; Other:100; IV Piggyback:300] Out: 3825 [Urine:2575; Emesis/NG output:1250]  PHYSICAL EXAMINATION:  General intubaed and sedated  HEENT No gross abnormalities. NGT in place.    Pulmonary Coarse bilaterally with decreased breath sounds bilateral bases R >L. Sync with vent   Cardiovascular  regular rhythm. S1, s2. No m/r/g. Distal pulses palpable.  Abdomen Soft, distendedhypoactive bowel sounds, no palpable organomegaly or masses  Musculoskeletal Grossly normal  Lymphatics No cervical, supraclavicular or axillary adenopathy.   Neurologic RASS -4  Skin/Integuement No rash, no cyanosis, no clubbing. Trace bipedal edema.     LABS: PULMONARY  Recent Labs Lab 04/23/16 1736 04/24/16 2026 04/25/16 0403 04/25/16 0615  PHART 7.366 7.358  7.424 7.385  PCO2ART 44.6 43.7 30.0* 32.3  PO2ART 92.0 61.0* 72.0* 79.3*  HCO3 25.6 24.6 19.6* 18.9*  TCO2 27 26 20   --   O2SAT 97.0 90.0 95.0 94.6    CBC  Recent Labs Lab 04/23/16 1850 04/24/16 0624 04/25/16 0030  HGB 6.5* 9.2* 10.5*  HCT 18.1* 26.1* 30.3*  WBC 7.9 12.4* 18.9*  PLT 105* 122* 188    COAGULATION  Recent Labs Lab 2016-05-11 1637 04/23/16 0737 04/24/16 0624 04/24/16 1042 04/25/16 0030  INR 1.58  1.63 1.44 1.39 1.44    CARDIAC  No results for input(s): TROPONINI in the last 168 hours. No results for input(s): PROBNP in the last 168 hours.   CHEMISTRY  Recent Labs Lab May 11, 2016 1637 04/23/16 0100 04/23/16 0521 04/24/16 0624 04/25/16 0030  NA 127* 128* 129* 133* 136  K 4.0 3.8 3.6 3.8 4.3  CL 88* 94* 95* 100* 102  CO2 25 20* 22 23 22   GLUCOSE 121* 135* 139* 133* 120*  BUN 36* 33* 31* 21* 25*  CREATININE 1.67* 1.48* 1.38* 1.12 1.51*  CALCIUM 9.1 8.4* 8.5* 9.0 9.4  MG  --   --   --   --  2.0   Estimated Creatinine Clearance: 70.3 mL/min (by C-G formula based on SCr of 1.51 mg/dL (H)).   LIVER  Recent Labs Lab 05/11/2016 1637 04/23/16 0521 04/23/16 0737 04/24/16 0624 04/24/16 1042 04/25/16 0030  AST 121* 112*  --   --   --  144*  ALT 52 50  --   --   --  54  ALKPHOS 55 52  --   --   --  65  BILITOT 3.3* 4.1*  --   --   --  5.7*  PROT 5.8* 5.7*  --   --   --  5.8*  ALBUMIN 3.3* 3.1*  --   --   --  3.0*  INR 1.58  --  1.63 1.44 1.39 1.44     INFECTIOUS  Recent Labs Lab 05-11-16 1652 04/25/16 0334  LATICACIDVEN 1.99* 5.2*  PROCALCITON  --  0.88     ENDOCRINE CBG (last 3)   Recent Labs  04/23/16 0740 04/23/16 1732 04/24/16 0753  GLUCAP 138* 127* 120*         IMAGING x48h  - image(s) personally visualized  -   highlighted in bold Ct Head Wo Contrast  Result Date: 04/24/2016 CLINICAL DATA:  Altered mental status EXAM: CT HEAD WITHOUT CONTRAST TECHNIQUE: Contiguous axial images were obtained from the base of the skull through the vertex without intravenous contrast. COMPARISON:  MRI 04/08/2016 FINDINGS: Brain: No acute intracranial abnormality. Specifically, no hemorrhage, hydrocephalus, mass lesion, acute infarction, or significant intracranial injury. Vascular: No hyperdense vessel or unexpected calcification. Skull: No acute calvarial abnormality Sinuses/Orbits: Visualized paranasal sinuses and mastoids clear. Orbital soft tissues  unremarkable. Other: None IMPRESSION: No acute intracranial abnormality. Electronically Signed   By: Charlett Nose M.D.   On: 04/24/2016 10:37   US Abdomen Complete  Result Date: 04/23/2016 CLINICAL DATA:  History jaundice and alcohol abuse. Elevated liver function tests. EXAM: ABDOMEN ULTRASOUND COMPLETE COMPARISON:  None. FINDINGS: Gallbladder: Gallbladder sludge. No wall thickening or pericholecystic fluid. Common bile duct: Diameter: Normal, 5 mm.  Not well visualized. Liver: Increased echogenicity. Mildly irregular hepatic capsule suspected, including on image 28. IVC: No abnormality visualized. Pancreas: Poorly visualized due to overlying bowel gas. Spleen: Size and appearance within normal limits. Right Kidney: Length: 10.7 cm. Echogenicity within normal limits. No  mass or hydronephrosis visualized. Left Kidney: Length: 12.0 cm. Echogenicity within normal limits. No mass or hydronephrosis visualized. Abdominal aorta: Portions of the aorta visualized are unremarkable. The proximal and distal aorta are not well visualized. Other findings: No ascites. Exam degraded by patient clinical condition and overlying bowel gas. IMPRESSION: 1. Decreased sensitivity and specificity exam due to technique related factors, as described above. 2. Increased echogenicity of the liver suggests hepatic steatosis. Probable concurrent cirrhosis. 3. Gallbladder sludge. Electronically Signed   By: Jeronimo Greaves M.D.   On: 04/23/2016 17:33   Ct Abdomen Pelvis W Contrast  Result Date: 04/24/2016 CLINICAL DATA:  Small bowel obstruction versus ileus. History of alcohol abuse. EXAM: CT ABDOMEN AND PELVIS WITH CONTRAST TECHNIQUE: Multidetector CT imaging of the abdomen and pelvis was performed using the standard protocol following bolus administration of intravenous contrast. CONTRAST:  ISOVUE-300 IOPAMIDOL (ISOVUE-300) INJECTION 61% COMPARISON:  Abdominal radiograph 04/24/2016 FINDINGS: Lower chest: Bilateral small pleural  effusions and associated bilateral lower lobe and right middle lobe atelectasis with air bronchograms. Hepatobiliary: There is a small amount of perihepatic ascites. The attenuation pattern of the liver is diffusely heterogeneous. Poor contrast opacification and streak artifact from the patient's right arm limit evaluation for small masses. Normal gallbladder. Pancreas: Normal pancreatic contours and enhancement. No peripancreatic fluid collection or pancreatic ductal dilatation. Spleen: Mild splenomegaly. Adrenals/Urinary Tract: Normal adrenal glands. No hydronephrosis or solid renal mass. Stomach/Bowel: The colon is diffusely dilated with associated air-fluid levels. There is no small bowel dilatation. There is no evidence of acute inflammation. There is ascitic fluid extending into the right lower quadrant. The appendix is not visualized. Vascular/Lymphatic: Normal course and caliber of the major abdominal vessels. No abdominal or pelvic adenopathy. Reproductive: Normal prostate and seminal vesicles. Musculoskeletal: No bony spinal canal stenosis. No lytic lesions. Normal visualized extrathoracic and extraperitoneal soft tissues. Other: No contributory non-categorized findings. IMPRESSION: 1. Diffusely dilated colon with multiple air-fluid levels. No associated small bowel dilatation. Together, the findings are most suggestive of adynamic ileus. 2. Heterogeneous attenuation pattern of the liver with nodular contours and ascites suggestive of hepatic cirrhosis. Non-urgent abdominal MRI may be helpful for further characterization, particularly to evaluate for underlying masses. 3. Bilateral small pleural effusions with bilateral lower lobe and right middle lobe atelectasis. Electronically Signed   By: Deatra Robinson M.D.   On: 04/24/2016 23:29   Dg Chest Port 1 View  Result Date: 04/25/2016 CLINICAL DATA:  Encounter for central line placement EXAM: PORTABLE CHEST 1 VIEW COMPARISON:  Yesterday FINDINGS:  Endotracheal tube tip between the clavicular heads and carina. Left IJ central line with tip at the upper cavoatrial junction. An orogastric tube reaches the stomach. Low volume chest with bibasilar opacity. There is both collapse and consolidation on abdominal CT from yesterday IMPRESSION: 1. Unremarkable positioning of tubes and central line. 2. Low volume chest with bibasilar atelectasis and/or pneumonia. Electronically Signed   By: Marnee Spring M.D.   On: 04/25/2016 07:07   Dg Chest Port 1 View  Result Date: 04/24/2016 CLINICAL DATA:  Acute onset of hypoxia.  Initial encounter. EXAM: PORTABLE CHEST 1 VIEW COMPARISON:  Chest radiograph performed 04/23/2016 FINDINGS: The lungs are hypoexpanded. Vascular congestion is noted. Increased interstitial markings may reflect mild interstitial edema. A small left pleural effusion is suggested. There is no evidence of pneumothorax. The cardiomediastinal silhouette is borderline normal in size. No acute osseous abnormalities are seen. IMPRESSION: Lungs hypoexpanded. Vascular congestion seen. Increased interstitial markings may reflect mild interstitial edema. Suggestion  of small left pleural effusion. Electronically Signed   By: Roanna RaiderJeffery  Chang M.D.   On: 04/24/2016 21:21   Dg Chest Port 1 View  Result Date: 04/23/2016 CLINICAL DATA:  Respiratory distress. EXAM: PORTABLE CHEST 1 VIEW COMPARISON:  PA and lateral chest 04/23/2016. Single-view of the chest 04/23/2015. FINDINGS: Lung volumes are lower than on the comparison studies. There is cardiomegaly and mild interstitial edema. Subsegmental atelectasis in the bases is noted. No pneumothorax or effusion. IMPRESSION: Cardiomegaly and mild interstitial edema. Bibasilar atelectasis in a low volume chest. Electronically Signed   By: Drusilla Kannerhomas  Dalessio M.D.   On: 04/23/2016 18:02   Dg Abd Portable 1v  Result Date: 04/24/2016 CLINICAL DATA:  Acute onset of generalized abdominal distention. Initial encounter. EXAM:  PORTABLE ABDOMEN - 1 VIEW COMPARISON:  Abdominal radiograph performed earlier today at 9:10 a.m. FINDINGS: The patient's enteric tube is noted coiling within the body of the stomach. The visualized bowel gas pattern is nonspecific, with diffusely distended loops of colon, likely reflecting mild ileus. No free intra-abdominal air is seen, though evaluation for free air is limited on this semi-erect view. No acute osseous abnormalities are identified. IMPRESSION: 1. Enteric tube noted coiling within the body of the stomach. 2. Diffusely distended loops of colon likely reflect mild ileus. No free intra-abdominal air seen. Electronically Signed   By: Roanna RaiderJeffery  Chang M.D.   On: 04/24/2016 21:22   Dg Abd Portable 1v  Result Date: 04/24/2016 CLINICAL DATA:  NG tube placement. EXAM: PORTABLE ABDOMEN - 1 VIEW COMPARISON:  None. FINDINGS: NG tube is in place with the tip in the fundus of the stomach. There is mild gaseous distention of small bowel. Large volume of stool in the transverse colon is noted. IMPRESSION: NG tube in good position. Large colonic stool burden noted. Electronically Signed   By: Drusilla Kannerhomas  Dalessio M.D.   On: 04/24/2016 09:27       DISCUSSION 11/19 Pt with subacute progressive axonal sensorimotor polyneuropathy, anemia, encephalopathy in setting of chronic alcohol use. Previous change in mental status was attributed to medication effect in the setting of hepatic encephalopathy. He may be more sensitive to sedating meds. Extensive workup by neurology in process.  Since initial PCCM evaluation, patient has continued to deteriorate with increasing oxygen requirements, interval development of ileus, worsening mental status, worsening renal function, and hypotension. Patient will move to ICU for intubation, central line placement and further monitoring.  ASSESSMENT/PLAN  RESP A:  acute hypoxemic respiratory failure due to acute encephalopathy  P - full vent support - needing  40%   CVS A: Circulatory shock - needing levophed low dose 11/19 - CVP 9   P Fluid bolus and reasess LEvophed for mAP > 65  RENAL A: Worsening lactic acidosis 11/19 P Fluid bolus and recheck 3pm 04/25/16  GI A: Ileus new since 04/24/16 folloing failed challenge with lactulose  P NG Tube to LIS CHeck lactate ABd port 04/26/16  ID A: ? 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 They want family conference with MD      The patient is critically ill with multiple organ systems  failure and requires high complexity decision making for assessment and support, frequent evaluation and titration of therapies, application of advanced monitoring technologies and extensive interpretation of multiple databases.   Critical Care Time devoted to patient care services described in this note is  45  Minutes. This time reflects time of care of this signee Dr Kalman Shan. This critical care time does not reflect procedure time, or teaching time or supervisory time of PA/NP/Med student/Med Resident etc but could involve care discussion time    Dr. Kalman Shan, M.D., [facility] Pulmonary and Critical Care Medicine Staff Physician [facility] [place] [facility] Pager: [phone], If no answer or between  15:00h - 7:00h: call [phone]  04/25/2016 1:23 PM

## 2016-04-25 NOTE — Progress Notes (Addendum)
Neurology Progress Note  Subjective: The patient had an eventful night. Chart has been reviewed and the case discussed with his offgoing RN. He as noted to have a change in his mental status at 2000 last night with desats to the upper 80s, tachypnea, and tachycardia. Cross cover assessed the patient and found him minimally responsive to verbal and tactile stimulation. There was some concern for an acute abdominal issue and a STAT CT of the abdomen that showed diffusely dilated colon and cirrhosis. He developed hypotension with BP 80s/40s with STAT labs showing leukocytosis with left shift, elevation in creatinine to 1.5, lactate 5.2,and procalcitonin 0.88. PCCM was consulted and the patient was intubated and transferred to the ICU. He was placed on broad spectrum antibiotics and lactulose enemas. He remains poorly responsive despite no sedation.   Current Meds:   Current Facility-Administered Medications:  .  0.9 % NaCl with KCl 20 mEq/ L  infusion, , Intravenous, Continuous, Florinda Marker, MD, Stopped at 04/25/16 0321 .  acetaminophen (TYLENOL) tablet 650 mg, 650 mg, Oral, Q6H PRN **OR** acetaminophen (TYLENOL) suppository 650 mg, 650 mg, Rectal, Q6H PRN, Burgess Estelle, MD .  ALPRAZolam Duanne Moron) tablet 0.5 mg, 0.5 mg, Oral, TID PRN, Burgess Estelle, MD .  chlorhexidine gluconate (MEDLINE KIT) (PERIDEX) 0.12 % solution 15 mL, 15 mL, Mouth Rinse, BID, Javier Glazier, MD, 15 mL at 04/25/16 0757 .  Chlorhexidine Gluconate Cloth 2 % PADS 6 each, 6 each, Topical, Q0600, Burgess Estelle, MD, 6 each at 04/25/16 0734 .  cholecalciferol (VITAMIN D) tablet 1,000 Units, 1,000 Units, Oral, Daily, Burgess Estelle, MD, 1,000 Units at 04/24/16 1705 .  fentaNYL (SUBLIMAZE) injection 100 mcg, 100 mcg, Intravenous, Q15 min PRN, Dannielle Burn, MD .  fentaNYL (SUBLIMAZE) injection 100 mcg, 100 mcg, Intravenous, Q2H PRN, Dannielle Burn, MD .  folic acid injection 1 mg, 1 mg, Intravenous, Daily, Burgess Estelle, MD,  1 mg at 04/24/16 1154 .  gabapentin (NEURONTIN) capsule 300 mg, 300 mg, Oral, TID, Burgess Estelle, MD, 300 mg at 04/24/16 1705 .  MEDLINE mouth rinse, 15 mL, Mouth Rinse, QID, Javier Glazier, MD .  midazolam (VERSED) 2 MG/2ML injection, , , ,  .  multivitamin with minerals tablet 1 tablet, 1 tablet, Oral, Daily, Burgess Estelle, MD, 1 tablet at 04/24/16 1154 .  mupirocin ointment (BACTROBAN) 2 % 1 application, 1 application, Nasal, BID, Burgess Estelle, MD, 1 application at 95/18/84 2323 .  ondansetron (ZOFRAN) tablet 4 mg, 4 mg, Oral, Q6H PRN **OR** ondansetron (ZOFRAN) injection 4 mg, 4 mg, Intravenous, Q6H PRN, Burgess Estelle, MD .  pantoprazole (PROTONIX) injection 40 mg, 40 mg, Intravenous, Daily, Lyndee Leo, RPH, 40 mg at 04/24/16 1153 .  piperacillin-tazobactam (ZOSYN) IVPB 3.375 g, 3.375 g, Intravenous, Q8H, Veronda P Bryk, RPH .  senna-docusate (Senokot-S) tablet 1 tablet, 1 tablet, Oral, QHS PRN, Burgess Estelle, MD .  sodium chloride flush (NS) 0.9 % injection 10-40 mL, 10-40 mL, Intracatheter, Q12H, Javier Glazier, MD .  sodium chloride flush (NS) 0.9 % injection 10-40 mL, 10-40 mL, Intracatheter, PRN, Javier Glazier, MD .  sodium chloride flush (NS) 0.9 % injection 3 mL, 3 mL, Intravenous, Q12H, Burgess Estelle, MD, 3 mL at 04/24/16 2323 .  thiamine (B-1) injection 100 mg, 100 mg, Intravenous, Daily, Burgess Estelle, MD, 100 mg at 04/24/16 1153 .  vancomycin (VANCOCIN) 1,250 mg in sodium chloride 0.9 % 250 mL IVPB, 1,250 mg, Intravenous, Once, [employer], RPH, 1,250 mg at 04/25/16  0810 .  vancomycin (VANCOCIN) 1,250 mg in sodium chloride 0.9 % 250 mL IVPB, 1,250 mg, Intravenous, Q12H, Veronda P Bryk, RPH  Objective:  Temp:  [98.2 F (36.8 C)-98.9 F (37.2 C)] 98.6 F (37 C) (11/19 0831) Pulse Rate:  [95-120] 101 (11/19 0823) Resp:  [12-31] 23 (11/19 0823) BP: (74-147)/(42-105) 89/48 (11/19 0823) SpO2:  [82 %-100 %] 99 % (11/19 0823) FiO2 (%):  [90 %-100 %] 90 % (11/19  0823) Weight:  [95.3 kg (210 lb 1.6 oz)] 95.3 kg (210 lb 1.6 oz) (11/19 0321)  General: WD ill-appearing jaundiced male lying in bed. He is now intubated. No sedation. He has no eye opening, either spontaneously or with stimulation. He does not follow any commands.  HEENT: Neck is supple without lymphadenopathy. Sclerae are icteric. There is no conjunctival injection. ETT, OGT in place.  CV: Tachy, regular, no murmur. Carotid pulses are 2+ and symmetric with no bruits. Distal pulses 2+ and symmetric.  Lungs: Coarse breath sounds bilaterally on anterior exam. Abdomen: Distended, rigid, hypoactive bowel sounds.   Extremities: Jaundiced. Mild edema BLE. Neuro: MS: As noted above.  CN: Pupils are equal and reactive from 3-->2 mm bilaterally. No blink to visual threat. Eyes are conjugate. Oculocephalics are intact. He has intact corneals. His face is grossly symmetric at rest and he has a symmetric grimace. The remainder of his cranial nerves cannot be accurately assessed as he does not participate with the exam.  Motor: Normal bulk. Tone is flaccid throughout. No spontaneous movement is observed. No tremor or other abnormal movements are observed.  Sensation: He grimaces to central pain, no real withdrawal. DTRs:.2+ biceps (L>R), absent otherwise. Toes are downgoing bilaterally. No pathological reflexes.  Coordination and gait: These cannot be assessed as the patient is unable to participate.   Labs: Lab Results  Component Value Date   WBC 18.9 (H) 04/25/2016   HGB 10.5 (L) 04/25/2016   HCT 30.3 (L) 04/25/2016   PLT 188 04/25/2016   GLUCOSE 120 (H) 04/25/2016   ALT 54 04/25/2016   AST 144 (H) 04/25/2016   NA 136 04/25/2016   K 4.3 04/25/2016   CL 102 04/25/2016   CREATININE 1.51 (H) 04/25/2016   BUN 25 (H) 04/25/2016   CO2 22 04/25/2016   TSH 3.810 04/01/2016   INR 1.44 04/25/2016   CBC Latest Ref Rng & Units 04/25/2016 04/24/2016 04/23/2016  WBC 4.0 - 10.5 K/uL 18.9(H) 12.4(H) 7.9   Hemoglobin 13.0 - 17.0 g/dL 10.5(L) 9.2(L) 6.5(LL)  Hematocrit 39.0 - 52.0 % 30.3(L) 26.1(L) 18.1(L)  Platelets 150 - 400 K/uL 188 122(L) 105(L)    No results found for: HGBA1C Lab Results  Component Value Date   ALT 54 04/25/2016   AST 144 (H) 04/25/2016   ALKPHOS 65 04/25/2016   BILITOT 5.7 (H) 04/25/2016   ABG: pH 7.385, pCO2 32.3, pO2 79.3, bicarb 18.9 procalcitonin 0.88 Lactate 5.2 Mg 2.0 Lipase 33  Blood cx pending  Urine cx pending  Radiology:  I have personally and independently reviewed the [facility] without contrast from 04/24/16. This shows a small hypodensity in the R internal capsule that is most suggestive of a chronic lacune. No acute abnormality.     A/P:   1. Subacute progressive axonal sensorimotor polyneuropathy: This involves the upper and lower extremities in a symmetric fashion with sparing of the cranial nerves and axial muscles. Differential extensive and workup as previously noted. LP will be deferred given coagulopathy. I do not think this is contributing to his  respiratory failure overnight given absence of bulbar or axial involvement to date.   2. Encephalopathy: This is likely multifactorial in etiology with contributions from liver failure, renal failure, hypoxic respiratory failure, shock with hypotension, possible sepsis. This is not clearly connected to his subacute polyneuropathy. Continue to optimize metabolic status and treat any infection as you are. No structural CNS pathology identified on yesterday's CTH.  Minimize CNS active meds, especially opiates, anything with strong anticholinergic properties, and benzos (unless needed for withdrawal management)--he is likely to have delayed metabolism and clearance of meds given hepatic and renal impairment.  3. Generalized weakness: This appears significant and is due to his neuropathy. PT/OT/rehab as able.   4. Dysesthesias: These are due to his neuropathy. Continue gabapentin.   5. Abnormality of  gait: This is the result of his weakness. PT/rehab as able.   Agapito Games, MD [facility]

## 2016-04-25 NOTE — Progress Notes (Signed)
Pharmacy Antibiotic Note  Ryan [name] is a 45 y.o. male admitted on 03-24-2016 with anemia, now w/ respiratory failure requiring intubation and pressors, concern for sepsis.  Pharmacy has been consulted for Vancocin and Zosyn dosing.  Plan: Vancomycin 1250mg  IV every 12 hours.  Goal trough 15-20 mcg/mL. Zosyn 3.375g IV q8h (4 hour infusion).  Height: 5' 9.5" (176.5 cm) Weight: 210 lb 1.6 oz (95.3 kg) IBW/kg (Calculated) : 71.85  Temp (24hrs), Avg:98.4 F (36.9 C), Min:98.2 F (36.8 C), Max:98.9 F (37.2 C)   Recent Labs Lab 2016-05-25 1637 2016-05-25 1652  04/23/16 0100 04/23/16 0521 04/23/16 0737 04/23/16 1850 04/24/16 0624 04/25/16 0030 04/25/16 0334  WBC  --   --   < >  --  9.4 9.0 7.9 12.4* 18.9*  --   CREATININE 1.67*  --   --  1.48* 1.38*  --   --  1.12 1.51*  --   LATICACIDVEN  --  1.99*  --   --   --   --   --   --   --  5.2*  < > = values in this interval not displayed.  Estimated Creatinine Clearance: 70.3 mL/min (by C-G formula based on SCr of 1.51 mg/dL (H)).    No Known Allergies   Thank you for allowing pharmacy to be a part of this patient's care.  Vernard GamblesVeronda Laiba Fuerte, PharmD, BCPS  04/25/2016 6:08 AM

## 2016-04-25 NOTE — Progress Notes (Signed)
CRITICAL VALUE ALERT  Critical value received:  Lactic Acid 7.1  Date of notification:  04/25/2016  Time of notification:  2050  Critical value read back:Yes.    Nurse who received alert:  Owens LofflerKaziah Miller, RN  MD notified (1st page):  Dellie CatholicSommers, MD  Time of first page:  2052  MD notified (2nd page):  Time of second page:  Responding MD:  Dellie CatholicSommers, MD  Time MD responded:  2100

## 2016-04-25 NOTE — Progress Notes (Signed)
Md at bedside made aware of critical Lactic acid 5.2. Pt's father called and updated on care  And transfer of pt to ICU.  Pt transferred and report given.  Ryan [name], Halvor Behrend T

## 2016-04-25 NOTE — Progress Notes (Signed)
RT attempted aline x3 unsuccessfully. RN notified.

## 2016-04-25 NOTE — Progress Notes (Signed)
Internal Medicine Night Float Interim Progress Note  S: Again, [name] is a [age] M with newly diagnosed Subacute Progressive Axonal Sensorimotor Polyneuropathy who [facility] with progressive weakness (polyneuropathy currently being worked up by Neurology as outpatient) and was found to be newly anemic with Hgb drop from 10 to 6 (with unclear cause--hemolytic process likely 2/2 chronic alcohol use versus vasculitis process). Patient was given 2 units pRBCs with stable hemoglobin. The following day, patient tried to leave AMA but was convinced to stay. Neurology evaluated patient in the hospital who recommended further laboratory work up, continuing gabapentin- but avoiding other sedating medications. It appears Neurology was considering starting IVIG as outpatient. On day 2 of hospital stay, patient became progressively more altered and lethargic with concern for hepatic encephalopathy given heavy alcohol use. Patient was started on lactulose enemas, then changed to lactulose per NGT Q1H yesterday. Despite this, patient became progressively more encephalopathic with poor stool output and also a developed a new oxygen requirement. His work up revealed an Ileus. NGT was changed to intermittent suction and patient had 1400 cc of output. He was started on IVF with NS with potassium 20 mEq/L at 125 cc/hr. A 500 cc bolus was started but there was access issues which delayed this. We went to evaluate the patient again and found him to be even more lethargic, barely awakening to sternal rub. He was also newly hypotensive in the 80s/40s despite IVF running. Labs revealed leukocytosis of 18 with left shift, hemoglobin stable, AKI with creatinine 1.5, procalcitonin 0.88, lactic acid 5.2. We re-consulted PCCM who kindly came to evaluate our patient and decided to proceed with intubation and transfer to the ICU.   O: Physical Exam Vitals:   04/25/16 0304 04/25/16 0321 04/25/16 0404 04/25/16 0421  BP: (!)  83/52 (!) 93/57 (!) 80/42 (!) 88/51  Pulse: (!) 109 (!) 108 (!) 106 (!) 104  Resp: (!) 31 (!) 29 (!) 31 (!) 31  Temp:  98.9 F (37.2 C)    TempSrc:  Axillary    SpO2: 99% 100% 99% 99%  Weight:  210 lb 1.6 oz (95.3 kg)    Height:       General: Vital signs reviewed. Patient isill appearing,in acute distress and cooperative with exam.  Cardiovascular: Tachycardic, regular rhythm Pulmonary/Chest:Upper airway rhonchi, diffuse rhonchorus sounds, paradoxical breathing Abdominal: Distended, tight and rigid, hypoactive bowel sounds, tympanic to percussion. No high-pitched tinkling sounds noted. No guarding present.  Neurological:Somnolent, barely opens eyes to sternal rub  A/P: [name] is a [age] M with newly diagnosed Subacute Progressive Axonal Sensorimotor Polyneuropathy who [facility] with progressive weakness.  Concern for Sepsis: Given tachycardia, tachypnea, lactic acidosis, hypotension, and recent hypothermia, patient likely has an underlying infection of unknown etiology. Procalcitonin elevated at 0.88. Patient is currently receiving IVF. Plans are to transfer to ICU for intubation. -Abx per PCCM -Urine Cx pending -Blood Cx pending -Repeat CBC in the morning -Continue IVF  Hypotension: Possibly due to underlying infection versus rapid removal of fluid via NGT. On IVF as above.  -IVF -May require pressors  Adynamic Ileus: Diffusely dilated colon with multiple air-fluid levels. No associated small bowel dilatation. Patient has had 1400 mL out via NGT. -Continue NGT on intermittent suction -Serial Abdominal exams -IVF accordingly -Keep K >4 and Mag >2  Cirrhosis: Confirmed on CT abdomen which showed heterogeneous attenuation pattern of the liver with nodular contours and ascites suggestive of hepatic cirrhosis. Non-urgent abdominal MRI may  be helpful for further characterization, particularly to evaluate for underlying masses. May be encephalopathic from  cirrhosis; however, why this has become an acute issue I am not sure. He was not previously on lactulose at home.  -Start lactulose enemas   Hypoxia: Likely secondary to increased abdominal pressure, poor inspiration. Could this be secondary to his underlying neurologic process affecting his diaphragm? CT abdomen showed bilateral small pleural effusions with bilateral lower lobe and right middle lobe atelectasis. -Given worsening mental status, patient will be moved to ICU and intubated  Subacute Progressive Axonal Sensorimotor Polyneuropathy: LP currently deferred due to coagulopathy. May need MRI for further evaluation. Would also strongly consider IVIG as this was being considered as outpatient; however, per Neurology this normally spares the cranial nerves and axial muscles and only affects the extremities, so it would not seem this is the route cause of his current underlying issues.  -Consider MRI/LP when able -IVIG?  Karlene LinemanAlexa Rasheena Talmadge, DO PGY-3 Internal Medicine Resident Pager # [phone] 04/25/2016 5:05 AM

## 2016-04-25 NOTE — Progress Notes (Signed)
Internal Medicine Night Float Interim Progress Note  S: Went to re-evaluate patient at [id number]. He continues to be lethargic, tense abdomen, tachypneic, tachycardic. He has had 1200 mL of output through his NGT.   O: Vitals:   04/24/16 2220 04/24/16 2300  BP: 115/62 (!) 126/93  Pulse: (!) 110 (!) 120  Resp: (!) 29 16  Temp:  98.8 F (37.1 C)   Physical Exam  General: Vital signs reviewed.  Patient is ill appearing, in acute distress and cooperative with exam.  Cardiovascular: Tachycardic, regular rhythm Pulmonary/Chest: Upper airway rhonchi, diffuse rhonchorus sounds, paradoxical breathing Abdominal: Distended, tight and rigid, hypoactive bowel sounds, tympanic to percussion. No high-pitched tinkling sounds noted. No guarding present.  Neurological: Somnolent  A/P: Adynamic Ileus: Diffusely dilated colon with multiple air-fluid levels. No associated small bowel dilatation. Patient has had 1200 mL out via NGT. -Continue NGT on intermittent suction -Serial Abdominal exams -Repeat Abdominal xray tomorrow am -STAT CBC, CMET pending for electrolyte abnormalites -IVF accordingly -Keep K >4 and Mag >2  Cirrhosis: Confirmed on CT abdomen which showed heterogeneous attenuation pattern of the liver with nodular contours and ascites suggestive of hepatic cirrhosis. Non-urgent abdominal MRI may be helpful for further characterization, particularly to evaluate for underlying masses. May be encephalopathic from cirrhosis; however, why this has become an acute issue I am not sure. He was not previously on lactulose at home.  -May require lactulose enemas as we are giving bowel rest as ileus improves  Hypoxia: Likely secondary to increased abdominal pressure, poor inspiration. Could this be secondary to his underlying neurologic process affecting his diaphragm? CT abdomen showed bilateral small pleural effusions with bilateral lower lobe and right middle lobe atelectasis. -Supplemental O2 -Treat as  above -Low threshold to move to ICU if deteriorating or not improving  Karlene LinemanAlexa Myan Suit, DO PGY-3 Internal Medicine Resident Pager # [phone] 04/25/2016 12:40 AM

## 2016-04-25 NOTE — Progress Notes (Addendum)
PULMONARY / CRITICAL CARE MEDICINE   Name: Ether Wolters MRN: [id number] DOB: [birth year]    ADMISSION DATE:  04/19/2016 CONSULTATION DATE:  04/23/16  REFERRING MD:  Richardo Priest MD  CHIEF COMPLAINT: Altered mental status, hypoxia.  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 This weakness has been progressive over the past 6 months. He's had an initial evaluation in [place]. He has had workup including MRI, EMG, and extensive serologies sent. He experienced a desaturation event on 11/17 and was evaluated by PCCM. At that time it was felt he was stable with supplemental oxygen and able to protect his airway. Since then, he has developed an ileus with 1400cc out of an NGT, his work of breathing has increase and his hypoxemia has worsened to the point that his pO2 is 71 on NRB. He is minimally responsive to noxious stimuli and I doubt his ability to protect his airway. He has also become somewhat hypotensive with systolics in the 80s (prior baseline was 110s to 130s). He is unable to answer any questions for me and only grimaces to noxious stimuli. GCS 7 (1, 1, 5)  PAST MEDICAL HISTORY :  He  has a past medical history of Alcohol abuse and Hypertension.  PAST SURGICAL HISTORY: He  has no past surgical history on file.  No Known Allergies  No current facility-administered medications on file prior to encounter.    Current Outpatient Prescriptions on File Prior to Encounter  Medication Sig  . ALPRAZolam (XANAX) 0.5 MG tablet Take 0.5 mg by mouth 3 (three) times daily as needed for anxiety.   . gabapentin (NEURONTIN) 300 MG capsule Take 1 capsule (300 mg total) by mouth 3 (three) times daily.  Marland Kitchen lisinopril (PRINIVIL,ZESTRIL) 40 MG tablet Take 40 mg by mouth daily.  Marland Kitchen omeprazole (PRILOSEC) 40 MG capsule Take 40 mg by mouth daily as needed (for acid reflux).   . traMADol  (ULTRAM) 50 MG tablet Take 1 tablet (50 mg total) by mouth every 6 (six) hours as needed. (Patient taking differently: Take 50 mg by mouth every 6 (six) hours as needed for moderate pain. )    FAMILY HISTORY:  His indicated that his mother is alive. He indicated that his father is alive. He indicated that his sister is alive. He indicated that his brother is alive.    SOCIAL HISTORY: He  reports that he has never smoked. He has never used smokeless tobacco. He reports that he drinks about 46.2 oz of alcohol per week . He reports that he does not use drugs.  REVIEW OF SYSTEMS:   Unable to obtain as patient has altered mental status  SUBJECTIVE:    VITAL SIGNS: BP (!) 88/51   Pulse (!) 104   Temp 98.9 F (37.2 C) (Axillary)   Resp (!) 31   Ht 5' 9.5" (1.765 m)   Wt 95.3 kg (210 lb 1.6 oz)   SpO2 99%   BMI 30.58 kg/m   HEMODYNAMICS:    VENTILATOR SETTINGS:    INTAKE / OUTPUT: I/O last 3 completed shifts: In: 2261.3 [P.O.:240; I.V.:1206.3; Blood:765; IV Piggyback:50] Out: 2050 [Urine:2050]  PHYSICAL EXAMINATION:  General Well nourished, well developed, mild distress, minimally responsive, jaundiced  HEENT No gross abnormalities. NGT in place.    Pulmonary Coarse bilaterally with decreased breath sounds bilateral bases R >L. Poor effort, symmetrical expansion.   Cardiovascular Tachy 100s, regular rhythm. S1, s2. No m/r/g.  Distal pulses palpable.  Abdomen Soft, distended, tender, hypoactive bowel sounds, no palpable organomegaly or masses. Tympanitic over the lower abdomen to percussion.  Musculoskeletal Grossly normal  Lymphatics No cervical, supraclavicular or axillary adenopathy.   Neurologic Minimally responsive (grimace) to noxious stimuli only. Localizes to pain.   Skin/Integuement No rash, no cyanosis, no clubbing. Trace bipedal edema.     LABS:  BMET  Recent Labs Lab 04/23/16 0521 04/24/16 0624 04/25/16 0030  NA 129* 133* 136  K 3.6 3.8 4.3  CL 95*  100* 102  CO2 22 23 22   BUN 31* 21* 25*  CREATININE 1.38* 1.12 1.51*  GLUCOSE 139* 133* 120*    Electrolytes  Recent Labs Lab 04/23/16 0521 04/24/16 0624 04/25/16 0030  CALCIUM 8.5* 9.0 9.4  MG  --   --  2.0    CBC  Recent Labs Lab 04/23/16 1850 04/24/16 0624 04/25/16 0030  WBC 7.9 12.4* 18.9*  HGB 6.5* 9.2* 10.5*  HCT 18.1* 26.1* 30.3*  PLT 105* 122* 188    Coag's  Recent Labs Lab 04/23/16 0737 04/24/16 0624 04/24/16 1042 04/25/16 0030  APTT 46* 42* 38*  --   INR 1.63 1.44 1.39 1.44    Sepsis Markers  Recent Labs Lab 05-14-2016 1652 04/25/16 0334  LATICACIDVEN 1.99* 5.2*  PROCALCITON  --  0.88    ABG  Recent Labs Lab 04/23/16 1736 04/24/16 2026 04/25/16 0403  PHART 7.366 7.358 7.424  PCO2ART 44.6 43.7 30.0*  PO2ART 92.0 61.0* 72.0*    Liver Enzymes  Recent Labs Lab May 14, 2016 1637 04/23/16 0521 04/25/16 0030  AST 121* 112* 144*  ALT 52 50 54  ALKPHOS 55 52 65  BILITOT 3.3* 4.1* 5.7*  ALBUMIN 3.3* 3.1* 3.0*    Cardiac Enzymes No results for input(s): TROPONINI, PROBNP in the last 168 hours.  Glucose  Recent Labs Lab 04/23/16 0740 04/23/16 1732 04/24/16 0753  GLUCAP 138* 127* 120*    Imaging Ct Head Wo Contrast  Result Date: 04/24/2016 CLINICAL DATA:  Altered mental status EXAM: CT HEAD WITHOUT CONTRAST TECHNIQUE: Contiguous axial images were obtained from the base of the skull through the vertex without intravenous contrast. COMPARISON:  MRI 04/08/2016 FINDINGS: Brain: No acute intracranial abnormality. Specifically, no hemorrhage, hydrocephalus, mass lesion, acute infarction, or significant intracranial injury. Vascular: No hyperdense vessel or unexpected calcification. Skull: No acute calvarial abnormality Sinuses/Orbits: Visualized paranasal sinuses and mastoids clear. Orbital soft tissues unremarkable. Other: None IMPRESSION: No acute intracranial abnormality. Electronically Signed   By: Charlett Nose M.D.   On: 04/24/2016  10:37   Ct Abdomen Pelvis W Contrast  Result Date: 04/24/2016 CLINICAL DATA:  Small bowel obstruction versus ileus. History of alcohol abuse. EXAM: CT ABDOMEN AND PELVIS WITH CONTRAST TECHNIQUE: Multidetector CT imaging of the abdomen and pelvis was performed using the standard protocol following bolus administration of intravenous contrast. CONTRAST:  ISOVUE-300 IOPAMIDOL (ISOVUE-300) INJECTION 61% COMPARISON:  Abdominal radiograph 04/24/2016 FINDINGS: Lower chest: Bilateral small pleural effusions and associated bilateral lower lobe and right middle lobe atelectasis with air bronchograms. Hepatobiliary: There is a small amount of perihepatic ascites. The attenuation pattern of the liver is diffusely heterogeneous. Poor contrast opacification and streak artifact from the patient's right arm limit evaluation for small masses. Normal gallbladder. Pancreas: Normal pancreatic contours and enhancement. No peripancreatic fluid collection or pancreatic ductal dilatation. Spleen: Mild splenomegaly. Adrenals/Urinary Tract: Normal adrenal glands. No hydronephrosis or solid renal mass. Stomach/Bowel: The colon is diffusely dilated with associated air-fluid levels. There is no small  bowel dilatation. There is no evidence of acute inflammation. There is ascitic fluid extending into the right lower quadrant. The appendix is not visualized. Vascular/Lymphatic: Normal course and caliber of the major abdominal vessels. No abdominal or pelvic adenopathy. Reproductive: Normal prostate and seminal vesicles. Musculoskeletal: No bony spinal canal stenosis. No lytic lesions. Normal visualized extrathoracic and extraperitoneal soft tissues. Other: No contributory non-categorized findings. IMPRESSION: 1. Diffusely dilated colon with multiple air-fluid levels. No associated small bowel dilatation. Together, the findings are most suggestive of adynamic ileus. 2. Heterogeneous attenuation pattern of the liver with nodular contours  and ascites suggestive of hepatic cirrhosis. Non-urgent abdominal MRI may be helpful for further characterization, particularly to evaluate for underlying masses. 3. Bilateral small pleural effusions with bilateral lower lobe and right middle lobe atelectasis. Electronically Signed   By: Deatra RobinsonKevin  Herman M.D.   On: 04/24/2016 23:29   Dg Chest Port 1 View  Result Date: 04/24/2016 CLINICAL DATA:  Acute onset of hypoxia.  Initial encounter. EXAM: PORTABLE CHEST 1 VIEW COMPARISON:  Chest radiograph performed 04/23/2016 FINDINGS: The lungs are hypoexpanded. Vascular congestion is noted. Increased interstitial markings may reflect mild interstitial edema. A small left pleural effusion is suggested. There is no evidence of pneumothorax. The cardiomediastinal silhouette is borderline normal in size. No acute osseous abnormalities are seen. IMPRESSION: Lungs hypoexpanded. Vascular congestion seen. Increased interstitial markings may reflect mild interstitial edema. Suggestion of small left pleural effusion. Electronically Signed   By: Roanna RaiderJeffery  Chang M.D.   On: 04/24/2016 21:21   Dg Abd Portable 1v  Result Date: 04/24/2016 CLINICAL DATA:  Acute onset of generalized abdominal distention. Initial encounter. EXAM: PORTABLE ABDOMEN - 1 VIEW COMPARISON:  Abdominal radiograph performed earlier today at 9:10 a.m. FINDINGS: The patient's enteric tube is noted coiling within the body of the stomach. The visualized bowel gas pattern is nonspecific, with diffusely distended loops of colon, likely reflecting mild ileus. No free intra-abdominal air is seen, though evaluation for free air is limited on this semi-erect view. No acute osseous abnormalities are identified. IMPRESSION: 1. Enteric tube noted coiling within the body of the stomach. 2. Diffusely distended loops of colon likely reflect mild ileus. No free intra-abdominal air seen. Electronically Signed   By: Roanna RaiderJeffery  Chang M.D.   On: 04/24/2016 21:22   Dg Abd Portable  1v  Result Date: 04/24/2016 CLINICAL DATA:  NG tube placement. EXAM: PORTABLE ABDOMEN - 1 VIEW COMPARISON:  None. FINDINGS: NG tube is in place with the tip in the fundus of the stomach. There is mild gaseous distention of small bowel. Large volume of stool in the transverse colon is noted. IMPRESSION: NG tube in good position. Large colonic stool burden noted. Electronically Signed   By: Drusilla Kannerhomas  Dalessio M.D.   On: 04/24/2016 09:27     STUDIES: (images independently reviewed) CT abdomen 04/24/16 -  IMPRESSION: 1. Diffusely dilated colon with multiple air-fluid levels. No associated small bowel dilatation. Together, the findings are most suggestive of adynamic ileus. 2. Heterogeneous attenuation pattern of the liver with nodular contours and ascites suggestive of hepatic cirrhosis. Non-urgent abdominal MRI may be helpful for further characterization, particularly to evaluate for underlying masses. 3. Bilateral small pleural effusions with bilateral lower lobe and right middle lobe atelectasis.  CXR 04/24/16 - increased interstitial edema, bibasilar atelectasis.  CULTURES: Blood 11/18 >> Urine 11/18 >>  ANTIBIOTICS: None at present  SIGNIFICANT EVENTS: Development of ileus, worsening mental status, worsening hypoxemia  LINES/TUBES: PIV  DISCUSSION: Pt with subacute progressive axonal sensorimotor  polyneuropathy, anemia, encephalopathy in setting of chronic alcohol use. Previous change in mental status was attributed to medication effect in the setting of hepatic encephalopathy. He may be more sensitive to sedating meds. Extensive workup by neurology in process.  Since initial PCCM evaluation, patient has continued to deteriorate with increasing oxygen requirements, interval development of ileus, worsening mental status, worsening renal function, and hypotension. Patient will move to ICU for intubation, central line placement and further monitoring.  1) acute hypoxemic  respiratory failure 2) altered mental status / toxic metabolic encephalopathy 3) AIDP 4) decompensated liver disease 5) evolving shock - likely distributive  Recommendations: - Intubation for airway protection and correction of hypoxemia - If BP fails to respond to fluid bolus, initiate pressors and consider CVC placement - Continue NGT to low intermittent suction - Lactulose enemas while NPO and NGT in place - Given clinical decompensation initiate broad spectrum antibiotics - Continued workup of underlying AIDP and altered mental status as per neurology.  The patient is critically ill with multiple organ system failure and requires high complexity decision making for assessment and support, frequent evaluation and titration of therapies, advanced monitoring, review of radiographic studies and interpretation of complex data.   Critical Care Time devoted to patient care services, exclusive of separately billable procedures, described in this note is 35 minutes.   Nita SickleSarah Ellen E. Torres Hardenbrook, MD Pulmonary and Critical Care 04/25/16 4:46 AM

## 2016-04-25 NOTE — Progress Notes (Signed)
eLink Physician-Brief Progress Note Patient Name: Ryan [name] DOB: 09/12/1970 MRN: [id number]   Date of Service  04/25/2016  HPI/Events of Note  Request for A-line.  eICU Interventions  Will order A-line placed by respiratory therapy.     Intervention Category Intermediate Interventions: Hypotension - evaluation and management  Sommer,Steven Eugene 04/25/2016, 9:41 PM

## 2016-04-25 NOTE — Procedures (Signed)
Central Venous Catheter Insertion Procedure Note Ryan [name] [id number] 07/18/1970  Procedure: Insertion of Central Venous Catheter Indications: Assessment of intravascular volume, Drug and/or fluid administration and Frequent blood sampling  Procedure Details Consent: Risks of procedure as well as the alternatives and risks of each were explained to the (patient/caregiver).  Consent for procedure obtained. Time Out: Verified patient identification, verified procedure, site/side was marked, verified correct patient position, special equipment/implants available, medications/allergies/relevent history reviewed, required imaging and test results available.  Performed  Maximum sterile technique was used including antiseptics, cap, gloves, gown, hand hygiene, mask and sheet. Skin prep: Chlorhexidine; local anesthetic administered A antimicrobial bonded/coated triple lumen catheter was placed in the left internal jugular vein using the Seldinger technique under ultrasound guidance.  After local anesthesia, the finder needle was advanced into the vessel under ultrasound guidance. Dark red non-pulsatile blood was aspirated. The wire was threaded into the vessel and needle removed. The wire was confirmed in the vein with ultrasound. A nick was made in the skin and the tract was dilated. The catheter was advanced over the wire into the vessel. The wire was removed. All ports aspirated and flushed easily. The catheter was sutured in place and biopatch and dressing were applied. No complications. Post procedure CXR as above. EBL <5cc.   Evaluation Blood flow good Complications: No apparent complications Patient did tolerate procedure well. Chest X-ray ordered to verify placement.  CXR: pending.  Ryan SickleSarah Ellen E. Nataleah Scioneaux, MD Pulmonary and Critical Care 04/25/16 5:59 AM

## 2016-04-25 NOTE — Progress Notes (Signed)
eLink Physician-Brief Progress Note Patient Name: Maggie FontJoseph Hunley DOB: 03/15/1971 MRN: [id number]   Date of Service  04/25/2016  HPI/Events of Note  Lactic Acid 5.2 >> 7.1 - Last Hgb = 10.5. BP = 90/41 and CVP = 10.  eICU Interventions  Will order: 1. Please titrate Norepinephrine to MAP >= 65.  2. Bolus with 0.9 NaCl 1 liter IV over 1 hour now.      Intervention Category Major Interventions: Acid-Base disturbance - evaluation and management  Bobbi Yount [place] 04/25/2016, 8:59 PM

## 2016-04-25 NOTE — Progress Notes (Signed)
Md paged pt O2 sats 80s placed on 2L [place] still sats 80s tried VM then placed on NRB o2 sats low 90's.  Pt bp 147/105 hr 121, lungs rhonci, throughout,   abd distended bowels hypoactive.  Pt drowsy will open eyes to voice. md to bedside.   Will continue to monitor Ryan [name], Ryan [name]

## 2016-04-25 NOTE — Consult Note (Signed)
[facility]       Reason for Consult: MSSA bacteremia    Referring Physician: CHAMP autoconsult  Active Problems:   Anemia   Acute respiratory failure with hypoxia (HCC)   Shock circulatory (HCC)   Lactic acidosis   . chlorhexidine gluconate (MEDLINE KIT)  15 mL Mouth Rinse BID  . Chlorhexidine Gluconate Cloth  6 each Topical Q0600  . cholecalciferol  1,000 Units Oral Daily  . [START ON 32/44/0102] folic acid  1 mg Oral Daily  . mouth rinse  15 mL Mouth Rinse QID  . midazolam      . mupirocin ointment  1 application Nasal BID  . pantoprazole (PROTONIX) IV  40 mg Intravenous Daily  . piperacillin-tazobactam (ZOSYN)  IV  3.375 g Intravenous Q8H  . sodium chloride flush  10-40 mL Intracatheter Q12H  . sodium chloride flush  3 mL Intravenous Q12H  . [START ON 04/26/2016] thiamine  100 mg Oral Daily  . vancomycin  1,250 mg Intravenous Q12H    Recommendations: Continue current antibiotics  TTE Repeat blood cultures  Assessment: He has recent decline, AMS wth polyneuropathy and now sepsis with more lethargy, hypotension now on pressors and intubated for airway protection and 1/2 blood cultures from yesterday with MSSA.    Antibiotics: Vancomycin and zosyn  HPI: Ryan [name] is a 45 y.o. male with alcoholism, cirrhosis, 6 months of progressive weakness, unclear history on admission with inconsistencies with motor weakness.  Work up underway but he was becoming more lethargic and as above, developed sepsis and with MSSA bacteremia.  He currently is intbuated on levophed.  History from the chart. He had some work up at [facility] but no notes available.  CT abd with dilated colon, cirrhosis.   WBC went up to 18.9 this am, increased lactate.   CT abd independently reviewed and dilation noted.   Review of Systems:  Unable to be assessed due to mental status All other systems reviewed and are negative    Past Medical History:  Diagnosis Date  . Alcohol  abuse   . Hypertension     Social History  Substance Use Topics  . Smoking status: Never Smoker  . Smokeless tobacco: Never Used  . Alcohol use 46.2 oz/week    56 Cans of beer, 21 Shots of liquor per week     Comment: 6-8 beers and whiskey daily    Family History  Problem Relation Age of Onset  . Heart disease Mother   . Heart disease Father   . Hypertension Brother     No Known Allergies  Physical Exam: Constitutional: intubated, no response Vitals:   04/25/16 1300 04/25/16 1400  BP: (!) 100/44 (!) 99/45  Pulse: (!) 112 (!) 110  Resp: (!) 26 (!) 27  Temp:     EYES: anicteric ENMT: +ET Cardiovascular: Cor RRR Respiratory: CTA B, anterior exam; on vent GI: Bowel sounds are normal, liver is not enlarged, spleen is not enlarged; not firm Musculoskeletal: no pedal edema noted Skin: negatives: no rash Hematologic: no cervical lad Lines: left IJ  Lab Results  Component Value Date   WBC 18.9 (H) 04/25/2016   HGB 10.5 (L) 04/25/2016   HCT 30.3 (L) 04/25/2016   MCV 92.1 04/25/2016   PLT 188 04/25/2016    Lab Results  Component Value Date   CREATININE 1.51 (H) 04/25/2016   BUN 25 (H) 04/25/2016   NA 136 04/25/2016   K 4.3 04/25/2016   CL 102  04/25/2016   CO2 22 04/25/2016    Lab Results  Component Value Date   ALT 54 04/25/2016   AST 144 (H) 04/25/2016   ALKPHOS 65 04/25/2016     Microbiology: Recent Results (from the past 240 hour(s))  MRSA PCR Screening     Status: Abnormal   Collection Time: 04/23/16  4:51 AM  Result Value Ref Range Status   MRSA by PCR POSITIVE (A) NEGATIVE Final    Comment:        The GeneXpert MRSA Assay (FDA approved for NASAL specimens only), is one component of a comprehensive MRSA colonization surveillance program. It is not intended to diagnose MRSA infection nor to guide or monitor treatment for MRSA infections. RESULT CALLED TO, READ BACK BY AND VERIFIED WITH: P STRAMOSKI,RN AT 2585 04/23/16 BY L BENFIELD   Urine  culture     Status: None   Collection Time: 04/24/16 10:12 AM  Result Value Ref Range Status   Specimen Description URINE, RANDOM  Final   Special Requests NONE  Final   Culture NO GROWTH  Final   Report Status 04/25/2016 FINAL  Final  Culture, blood (Routine X 2) w Reflex to ID Panel     Status: None (Preliminary result)   Collection Time: 04/24/16 10:45 AM  Result Value Ref Range Status   Specimen Description BLOOD LEFT ANTECUBITAL  Final   Special Requests IN PEDIATRIC BOTTLE 2CC  Final   Culture  Setup Time   Final    GRAM POSITIVE COCCI IN CLUSTERS IN PEDIATRIC BOTTLE CRITICAL RESULT CALLED TO, READ BACK BY AND VERIFIED WITH: A MASTERS,PHARMD AT 2778 04/25/16 BY L BENFIELD    Culture GRAM POSITIVE COCCI  Final   Report Status PENDING  Incomplete  Blood Culture ID Panel (Reflexed)     Status: Abnormal   Collection Time: 04/24/16 10:45 AM  Result Value Ref Range Status   Enterococcus species NOT DETECTED NOT DETECTED Final   Listeria monocytogenes NOT DETECTED NOT DETECTED Final   Staphylococcus species DETECTED (A) NOT DETECTED Final    Comment: CRITICAL RESULT CALLED TO, READ BACK BY AND VERIFIED WITH: A MASTERS,PHARMD AT 1355 04/25/16 BY L BENFIELD    Staphylococcus aureus DETECTED (A) NOT DETECTED Final    Comment: CRITICAL RESULT CALLED TO, READ BACK BY AND VERIFIED WITH: A MASTERS,PHARMD AT 1355 04/25/16 BY L BENFIELD    Methicillin resistance NOT DETECTED NOT DETECTED Final   Streptococcus species NOT DETECTED NOT DETECTED Final   Streptococcus agalactiae NOT DETECTED NOT DETECTED Final   Streptococcus pneumoniae NOT DETECTED NOT DETECTED Final   Streptococcus pyogenes NOT DETECTED NOT DETECTED Final   Acinetobacter baumannii NOT DETECTED NOT DETECTED Final   Enterobacteriaceae species NOT DETECTED NOT DETECTED Final   Enterobacter cloacae complex NOT DETECTED NOT DETECTED Final   Escherichia coli NOT DETECTED NOT DETECTED Final   Klebsiella oxytoca NOT DETECTED NOT  DETECTED Final   Klebsiella pneumoniae NOT DETECTED NOT DETECTED Final   Proteus species NOT DETECTED NOT DETECTED Final   Serratia marcescens NOT DETECTED NOT DETECTED Final   Haemophilus influenzae NOT DETECTED NOT DETECTED Final   Neisseria meningitidis NOT DETECTED NOT DETECTED Final   Pseudomonas aeruginosa NOT DETECTED NOT DETECTED Final   Candida albicans NOT DETECTED NOT DETECTED Final   Candida glabrata NOT DETECTED NOT DETECTED Final   Candida krusei NOT DETECTED NOT DETECTED Final   Candida parapsilosis NOT DETECTED NOT DETECTED Final   Candida tropicalis NOT DETECTED NOT DETECTED Final  Scharlene Gloss, [facility] [place] [email or web address] [phone] pager  [phone] cell 04/25/2016, 3:56 PM

## 2016-04-26 ENCOUNTER — Inpatient Hospital Stay: Admission: RE | Admit: 2016-04-26 | Payer: Self-pay | Source: Ambulatory Visit

## 2016-04-26 ENCOUNTER — Inpatient Hospital Stay (HOSPITAL_COMMUNITY): Payer: BLUE CROSS/BLUE SHIELD

## 2016-04-26 ENCOUNTER — Encounter (HOSPITAL_COMMUNITY): Payer: BLUE CROSS/BLUE SHIELD

## 2016-04-26 DIAGNOSIS — J9602 Acute respiratory failure with hypercapnia: Secondary | ICD-10-CM

## 2016-04-26 DIAGNOSIS — E872 Acidosis: Secondary | ICD-10-CM

## 2016-04-26 DIAGNOSIS — J690 Pneumonitis due to inhalation of food and vomit: Secondary | ICD-10-CM

## 2016-04-26 DIAGNOSIS — R410 Disorientation, unspecified: Secondary | ICD-10-CM

## 2016-04-26 DIAGNOSIS — R4182 Altered mental status, unspecified: Secondary | ICD-10-CM

## 2016-04-26 DIAGNOSIS — K567 Ileus, unspecified: Secondary | ICD-10-CM

## 2016-04-26 DIAGNOSIS — G40901 Epilepsy, unspecified, not intractable, with status epilepticus: Secondary | ICD-10-CM

## 2016-04-26 DIAGNOSIS — K7290 Hepatic failure, unspecified without coma: Secondary | ICD-10-CM

## 2016-04-26 DIAGNOSIS — R0603 Acute respiratory distress: Secondary | ICD-10-CM

## 2016-04-26 DIAGNOSIS — R569 Unspecified convulsions: Secondary | ICD-10-CM

## 2016-04-26 DIAGNOSIS — R400 Somnolence: Secondary | ICD-10-CM

## 2016-04-26 DIAGNOSIS — R579 Shock, unspecified: Secondary | ICD-10-CM

## 2016-04-26 DIAGNOSIS — N179 Acute kidney failure, unspecified: Secondary | ICD-10-CM

## 2016-04-26 DIAGNOSIS — R7881 Bacteremia: Secondary | ICD-10-CM

## 2016-04-26 LAB — BASIC METABOLIC PANEL
Anion gap: 17 — ABNORMAL HIGH (ref 5–15)
Anion gap: 19 — ABNORMAL HIGH (ref 5–15)
Anion gap: 21 — ABNORMAL HIGH (ref 5–15)
BUN: 53 mg/dL — ABNORMAL HIGH (ref 6–20)
BUN: 50 mg/dL — AB (ref 6–20)
BUN: 57 mg/dL — ABNORMAL HIGH (ref 6–20)
CALCIUM: 8 mg/dL — AB (ref 8.9–10.3)
CHLORIDE: 104 mmol/L (ref 101–111)
CHLORIDE: 104 mmol/L (ref 101–111)
CHLORIDE: 105 mmol/L (ref 101–111)
CO2: 10 mmol/L — ABNORMAL LOW (ref 22–32)
CO2: 14 mmol/L — ABNORMAL LOW (ref 22–32)
CO2: 9 mmol/L — ABNORMAL LOW (ref 22–32)
CREATININE: 3.96 mg/dL — AB (ref 0.61–1.24)
CREATININE: 3.32 mg/dL — AB (ref 0.61–1.24)
Calcium: 7.3 mg/dL — ABNORMAL LOW (ref 8.9–10.3)
Calcium: 7.3 mg/dL — ABNORMAL LOW (ref 8.9–10.3)
Creatinine, Ser: 3.58 mg/dL — ABNORMAL HIGH (ref 0.61–1.24)
GFR calc non Af Amer: 17 mL/min — ABNORMAL LOW (ref >60)
GFR calc non Af Amer: 19 mL/min — ABNORMAL LOW (ref >60)
GFR calc non Af Amer: 21 mL/min — ABNORMAL LOW (ref >60)
GFR, EST AFRICAN AMERICAN: 19 mL/min — AB (ref >60)
GFR, EST AFRICAN AMERICAN: 22 mL/min — AB (ref >60)
GFR, EST AFRICAN AMERICAN: 24 mL/min — AB (ref >60)
Glucose, Bld: 100 mg/dL — ABNORMAL HIGH (ref 65–99)
Glucose, Bld: 111 mg/dL — ABNORMAL HIGH (ref 65–99)
Glucose, Bld: 59 mg/dL — ABNORMAL LOW (ref 65–99)
POTASSIUM: 4.1 mmol/L (ref 3.5–5.1)
POTASSIUM: 5.2 mmol/L — AB (ref 3.5–5.1)
Potassium: 3.8 mmol/L (ref 3.5–5.1)
SODIUM: 133 mmol/L — AB (ref 135–145)
SODIUM: 134 mmol/L — AB (ref 135–145)
SODIUM: 136 mmol/L (ref 135–145)

## 2016-04-26 LAB — ECHOCARDIOGRAM COMPLETE
HEIGHTINCHES: 69.5 in
WEIGHTICAEL: 3668.45 [oz_av]

## 2016-04-26 LAB — POCT I-STAT 3, ART BLOOD GAS (G3+)
ACID-BASE DEFICIT: 21 mmol/L — AB (ref 0.0–2.0)
Acid-base deficit: 18 mmol/L — ABNORMAL HIGH (ref 0.0–2.0)
Acid-base deficit: 20 mmol/L — ABNORMAL HIGH (ref 0.0–2.0)
BICARBONATE: 10.3 mmol/L — AB (ref 20.0–28.0)
Bicarbonate: 9.8 mmol/L — ABNORMAL LOW (ref 20.0–28.0)
Bicarbonate: 7.9 mmol/L — ABNORMAL LOW (ref 20.0–28.0)
O2 SAT: 76 %
O2 Saturation: 83 %
O2 Saturation: 96 %
PCO2 ART: 33.2 mmHg (ref 32.0–48.0)
PCO2 ART: 39.1 mmHg (ref 32.0–48.0)
PH ART: 7.011 — AB (ref 7.350–7.450)
PH ART: 7.044 — AB (ref 7.350–7.450)
PO2 ART: 63 mmHg — AB (ref 83.0–108.0)
Patient temperature: 98.9
TCO2: 11 mmol/L (ref 0–100)
TCO2: 11 mmol/L (ref 0–100)
TCO2: 9 mmol/L (ref 0–100)
pCO2 arterial: 29.5 mmHg — ABNORMAL LOW (ref 32.0–48.0)
pH, Arterial: 7.101 — CL (ref 7.350–7.450)
pO2, Arterial: 63 mmHg — ABNORMAL LOW (ref 83.0–108.0)
pO2, Arterial: 118 mmHg — ABNORMAL HIGH (ref 83.0–108.0)

## 2016-04-26 LAB — CBC WITH DIFFERENTIAL/PLATELET
BASOS ABS: 0 10*3/uL (ref 0.0–0.1)
BLASTS: 0 %
Band Neutrophils: 0 %
Basophils Relative: 0 %
Eosinophils Absolute: 0.1 10*3/uL (ref 0.0–0.7)
Eosinophils Relative: 1 %
HCT: 26.5 % — ABNORMAL LOW (ref 39.0–52.0)
HEMOGLOBIN: 8.9 g/dL — AB (ref 13.0–17.0)
Lymphocytes Relative: 21 %
Lymphs Abs: 2.7 10*3/uL (ref 0.7–4.0)
MCH: 31.8 pg (ref 26.0–34.0)
MCHC: 33.6 g/dL (ref 30.0–36.0)
MCV: 94.6 fL (ref 78.0–100.0)
METAMYELOCYTES PCT: 0 %
MONOS PCT: 4 %
MYELOCYTES: 0 %
Monocytes Absolute: 0.5 10*3/uL (ref 0.1–1.0)
Neutro Abs: 9.6 10*3/uL — ABNORMAL HIGH (ref 1.7–7.7)
Neutrophils Relative %: 74 %
Other: 0 %
Platelets: 189 10*3/uL (ref 150–400)
Promyelocytes Absolute: 0 %
RBC: 2.8 MIL/uL — AB (ref 4.22–5.81)
RDW: 19.1 % — ABNORMAL HIGH (ref 11.5–15.5)
WBC MORPHOLOGY: INCREASED
WBC: 12.9 10*3/uL — AB (ref 4.0–10.5)
nRBC: 0 /100 WBC

## 2016-04-26 LAB — HEPATIC FUNCTION PANEL
ALK PHOS: 60 U/L (ref 38–126)
ALT: 45 U/L (ref 17–63)
AST: 142 U/L — ABNORMAL HIGH (ref 15–41)
Albumin: 1.9 g/dL — ABNORMAL LOW (ref 3.5–5.0)
BILIRUBIN INDIRECT: 1.9 mg/dL — AB (ref 0.3–0.9)
Bilirubin, Direct: 2.7 mg/dL — ABNORMAL HIGH (ref 0.1–0.5)
TOTAL PROTEIN: 4.4 g/dL — AB (ref 6.5–8.1)
Total Bilirubin: 4.6 mg/dL — ABNORMAL HIGH (ref 0.3–1.2)

## 2016-04-26 LAB — LACTIC ACID, PLASMA
LACTIC ACID, VENOUS: 9.6 mmol/L — AB (ref 0.5–1.9)
LACTIC ACID, VENOUS: 10.1 mmol/L — AB (ref 0.5–1.9)
Lactic Acid, Venous: 9 mmol/L (ref 0.5–1.9)

## 2016-04-26 LAB — CBC
HCT: 30.4 % — ABNORMAL LOW (ref 39.0–52.0)
HEMOGLOBIN: 10.1 g/dL — AB (ref 13.0–17.0)
MCH: 31.4 pg (ref 26.0–34.0)
MCHC: 33.2 g/dL (ref 30.0–36.0)
MCV: 94.4 fL (ref 78.0–100.0)
Platelets: 223 10*3/uL (ref 150–400)
RBC: 3.22 MIL/uL — ABNORMAL LOW (ref 4.22–5.81)
RDW: 18.8 % — AB (ref 11.5–15.5)
WBC: 9.1 10*3/uL (ref 4.0–10.5)

## 2016-04-26 LAB — ANCA TITERS
Atypical P-ANCA titer: <1:20 {titer}
C-ANCA: <1:20 {titer}
P-ANCA: <1:20 {titer}

## 2016-04-26 LAB — MAGNESIUM: Magnesium: 1.9 mg/dL (ref 1.7–2.4)

## 2016-04-26 LAB — GLUCOSE, CAPILLARY: Glucose-Capillary: 94 mg/dL (ref 65–99)

## 2016-04-26 LAB — MISCELLANEOUS TEST

## 2016-04-26 LAB — AMMONIA: AMMONIA: 574 umol/L — AB (ref 9–35)

## 2016-04-26 LAB — PHOSPHORUS: Phosphorus: 3.1 mg/dL (ref 2.5–4.6)

## 2016-04-26 LAB — LACTATE DEHYDROGENASE: LDH: 486 U/L — ABNORMAL HIGH (ref 98–192)

## 2016-04-26 MED ORDER — LORAZEPAM 2 MG/ML IJ SOLN
4.0000 mg | Freq: Once | INTRAMUSCULAR | Status: AC
  Administered 2016-04-26: 4 mg via INTRAVENOUS
  Filled 2016-04-26: qty 2

## 2016-04-26 MED ORDER — CISATRACURIUM BOLUS VIA INFUSION
0.1000 mg/kg | Freq: Once | INTRAVENOUS | Status: DC
  Filled 2016-04-26: qty 11

## 2016-04-26 MED ORDER — SODIUM POLYSTYRENE SULFONATE 15 GM/60ML PO SUSP
30.0000 g | Freq: Once | ORAL | Status: AC
  Administered 2016-04-26: 30 g
  Filled 2016-04-26: qty 120

## 2016-04-26 MED ORDER — NOREPINEPHRINE BITARTRATE 1 MG/ML IV SOLN
0.0000 ug/min | INTRAVENOUS | Status: DC
  Administered 2016-04-26: 60 ug/min via INTRAVENOUS
  Administered 2016-04-26: 35 ug/min via INTRAVENOUS
  Administered 2016-04-26 – 2016-04-27 (×3): 60 ug/min via INTRAVENOUS
  Filled 2016-04-26 (×6): qty 16

## 2016-04-26 MED ORDER — MIDAZOLAM HCL 5 MG/ML IJ SOLN: 19.0000 mg | Freq: Once | INTRAMUSCULAR | Status: DC

## 2016-04-26 MED ORDER — LORAZEPAM 2 MG/ML IJ SOLN
2.0000 mg | Freq: Once | INTRAMUSCULAR | Status: AC
  Administered 2016-04-26: 2 mg via INTRAVENOUS
  Filled 2016-04-26: qty 1

## 2016-04-26 MED ORDER — FENTANYL CITRATE (PF) 100 MCG/2ML IJ SOLN: 100.0000 ug | Freq: Once | INTRAMUSCULAR | Status: DC

## 2016-04-26 MED ORDER — LORAZEPAM 2 MG/ML IJ SOLN
INTRAMUSCULAR | Status: AC
Start: 2016-04-26 — End: 2016-04-26
  Filled 2016-04-26: qty 2

## 2016-04-26 MED ORDER — SODIUM CHLORIDE 0.9 % IV SOLN
1000.0000 mg | Freq: Two times a day (BID) | INTRAVENOUS | Status: DC
  Administered 2016-04-26 (×3): 1000 mg via INTRAVENOUS
  Filled 2016-04-26 (×4): qty 10

## 2016-04-26 MED ORDER — VASOPRESSIN 20 UNIT/ML IV SOLN
0.0300 [IU]/min | INTRAVENOUS | Status: DC
  Administered 2016-04-26 (×2): 0.03 [IU]/min via INTRAVENOUS
  Filled 2016-04-26 (×2): qty 2

## 2016-04-26 MED ORDER — ROCURONIUM BROMIDE 50 MG/5ML IV SOLN
95.0000 mg | Freq: Once | INTRAVENOUS | Status: AC
Start: 2016-04-26 — End: 2016-04-26
  Filled 2016-04-26: qty 9.5

## 2016-04-26 MED ORDER — SODIUM CHLORIDE 0.9 % IV BOLUS (SEPSIS)
1000.0000 mL | Freq: Once | INTRAVENOUS | Status: AC
  Administered 2016-04-26: 1000 mL via INTRAVENOUS

## 2016-04-26 MED ORDER — ROCURONIUM BROMIDE 50 MG/5ML IV SOLN
95.0000 mg | Freq: Once | INTRAVENOUS | Status: AC
  Administered 2016-04-26: 95 mg via INTRAVENOUS
  Filled 2016-04-26: qty 9.5

## 2016-04-26 MED ORDER — HYDROCORTISONE NA SUCCINATE PF 100 MG IJ SOLR
50.0000 mg | Freq: Four times a day (QID) | INTRAMUSCULAR | Status: DC
  Administered 2016-04-26 – 2016-04-27 (×3): 50 mg via INTRAVENOUS
  Filled 2016-04-26 (×3): qty 2

## 2016-04-26 MED ORDER — MIDAZOLAM HCL 2 MG/2ML IJ SOLN: 2.0000 mg | Freq: Once | INTRAMUSCULAR | Status: DC | PRN

## 2016-04-26 MED ORDER — LORAZEPAM 2 MG/ML IJ SOLN
2.0000 mg | Freq: Four times a day (QID) | INTRAMUSCULAR | Status: DC
  Administered 2016-04-26: 2 mg via INTRAVENOUS
  Filled 2016-04-26: qty 1

## 2016-04-26 MED ORDER — ROCURONIUM BROMIDE 50 MG/5ML IV SOLN
95.0000 mg | Freq: Once | INTRAVENOUS | Status: DC
  Administered 2016-04-26: 95 mg via INTRAVENOUS

## 2016-04-26 MED ORDER — HYDROCORTISONE NA SUCCINATE PF 100 MG IJ SOLR
100.0000 mg | Freq: Three times a day (TID) | INTRAMUSCULAR | Status: DC
  Administered 2016-04-26: 100 mg via INTRAVENOUS
  Filled 2016-04-26: qty 2

## 2016-04-26 MED ORDER — FENTANYL BOLUS VIA INFUSION
50.0000 ug | INTRAVENOUS | Status: DC | PRN
  Filled 2016-04-26: qty 50

## 2016-04-26 MED ORDER — MIDAZOLAM BOLUS VIA INFUSION
19.0000 mg | Freq: Once | INTRAVENOUS | Status: AC
  Administered 2016-04-26: 19 mg via INTRAVENOUS
  Filled 2016-04-26: qty 19

## 2016-04-26 MED ORDER — SODIUM CHLORIDE 0.9 % IV SOLN
25.0000 mg/h | INTRAVENOUS | Status: DC
  Administered 2016-04-26: 19 mg/h via INTRAVENOUS
  Filled 2016-04-26 (×2): qty 10

## 2016-04-26 MED ORDER — LORAZEPAM 2 MG/ML IJ SOLN
INTRAMUSCULAR | Status: AC
  Administered 2016-04-26: 2 mg via INTRAVENOUS
  Filled 2016-04-26: qty 2

## 2016-04-26 MED ORDER — MIDAZOLAM HCL 2 MG/2ML IJ SOLN
2.0000 mg | Freq: Once | INTRAMUSCULAR | Status: AC
  Administered 2016-04-26: 2 mg via INTRAVENOUS

## 2016-04-26 MED ORDER — LORAZEPAM 2 MG/ML IJ SOLN
2.0000 mg | Freq: Once | INTRAMUSCULAR | Status: AC
  Administered 2016-04-26: 2 mg via INTRAVENOUS

## 2016-04-26 MED ORDER — RIFAXIMIN 550 MG PO TABS
550.0000 mg | ORAL_TABLET | Freq: Three times a day (TID) | ORAL | Status: DC
  Administered 2016-04-26 (×3): 550 mg via ORAL
  Filled 2016-04-26 (×5): qty 1

## 2016-04-26 MED ORDER — LACTULOSE 10 GM/15ML PO SOLN
30.0000 g | Freq: Four times a day (QID) | ORAL | Status: DC
  Administered 2016-04-26 (×3): 30 g
  Filled 2016-04-26 (×3): qty 45

## 2016-04-26 MED ORDER — MIDAZOLAM BOLUS VIA INFUSION
2.0000 mg | INTRAVENOUS | Status: DC | PRN
  Filled 2016-04-26: qty 2

## 2016-04-26 MED ORDER — VANCOMYCIN HCL IN DEXTROSE 1-5 GM/200ML-% IV SOLN
1000.0000 mg | INTRAVENOUS | Status: DC
  Filled 2016-04-26: qty 200

## 2016-04-26 MED ORDER — SODIUM BICARBONATE 8.4 % IV SOLN
100.0000 meq | Freq: Once | INTRAVENOUS | Status: AC
  Administered 2016-04-26: 100 meq via INTRAVENOUS
  Filled 2016-04-26: qty 100
  Filled 2016-04-26: qty 50

## 2016-04-26 MED ORDER — SODIUM CHLORIDE 0.9 % IV SOLN
25.0000 mg/h | INTRAVENOUS | Status: DC
  Administered 2016-04-26: 25 mg/h via INTRAVENOUS
  Filled 2016-04-26: qty 20

## 2016-04-26 MED ORDER — LORAZEPAM 2 MG/ML IJ SOLN
INTRAMUSCULAR | Status: AC
  Administered 2016-04-26: 2 mg via INTRAVENOUS
  Filled 2016-04-26: qty 1

## 2016-04-26 MED ORDER — LORAZEPAM 2 MG/ML IJ SOLN
4.0000 mg | Freq: Once | INTRAMUSCULAR | Status: AC
  Administered 2016-04-26: 4 mg via INTRAVENOUS

## 2016-04-26 MED ORDER — ARTIFICIAL TEARS OP OINT
1.0000 "application " | TOPICAL_OINTMENT | Freq: Three times a day (TID) | OPHTHALMIC | Status: DC
  Administered 2016-04-26: 1 via OPHTHALMIC

## 2016-04-26 MED ORDER — MIDAZOLAM HCL 5 MG/ML IJ SOLN
2.0000 mg/h | INTRAMUSCULAR | Status: DC
  Administered 2016-04-26 (×3): 10 mg/h via INTRAVENOUS
  Filled 2016-04-26 (×4): qty 10

## 2016-04-26 MED ORDER — FENTANYL CITRATE (PF) 100 MCG/2ML IJ SOLN: 100.0000 ug | Freq: Once | INTRAMUSCULAR | Status: DC | PRN

## 2016-04-26 MED ORDER — SODIUM CHLORIDE 0.9 % IV SOLN
3.0000 ug/kg/min | INTRAVENOUS | Status: DC
  Filled 2016-04-26: qty 20

## 2016-04-26 MED ORDER — SODIUM CHLORIDE 0.9 % IV SOLN
INTRAVENOUS | Status: DC
  Administered 2016-04-26 (×2): via INTRAVENOUS

## 2016-04-26 MED ORDER — FENTANYL 2500MCG IN NS 250ML (10MCG/ML) PREMIX INFUSION: 100.0000 ug/h | INTRAVENOUS | Status: DC

## 2016-04-26 MED ORDER — SODIUM BICARBONATE 8.4 % IV SOLN
INTRAVENOUS | Status: DC
  Administered 2016-04-26 (×2): via INTRAVENOUS
  Filled 2016-04-26 (×7): qty 150

## 2016-04-26 NOTE — Procedures (Signed)
Arterial Catheter Insertion Procedure Note Maggie FontJoseph Dials [id number] 07/11/1970  Procedure: Insertion of Arterial Catheter  Indications: Blood pressure monitoring  Procedure Details Consent: Unable to obtain consent because of emergent medical necessity. Time Out: Verified patient identification, verified procedure, site/side was marked, verified correct patient position, special equipment/implants available, medications/allergies/relevent history reviewed, required imaging and test results available.  Performed  Maximum sterile technique was used including antiseptics, cap, gloves, gown, hand hygiene, mask and sheet. Skin prep: Chlorhexidine; local anesthetic administered 24 gauge catheter was inserted into right femoral artery using the Seldinger technique.  Evaluation Blood flow good; BP tracing good. Complications: No apparent complications.   Dejanee Thibeaux 04/26/2016

## 2016-04-26 NOTE — Progress Notes (Signed)
  CCM aware of lactic 9.0.

## 2016-04-26 NOTE — Progress Notes (Signed)
Family requesting information for educational purposes regarding an ileus. Education document printed and given to patient's family.

## 2016-04-26 NOTE — Progress Notes (Signed)
eLink Physician-Brief Progress Note Patient Name: Ryan FontJoseph Baugh DOB: 10/20/1970 MRN: [id number]   Date of Service  04/26/2016  HPI/Events of Note  Sudden seizure activity Right arm and leg contracting rhythmically  eICU Interventions  Ativan IV stat Keppra 1gm q12h EEG  Neuro consultation     Intervention Category Major Interventions: Seizures - evaluation and management  Max FickleDouglas Spencer Cardinal 04/26/2016, 1:18 AM

## 2016-04-26 NOTE — Consult Note (Addendum)
NEURO HOSPITALIST CONSULT NOTE   Requestig physician: Dr. Lake Bells  Reason for Consult: New onset seizure  History obtained from:   Chart    HPI:                                                                                                                                          Ryan [name] is an 45 y.o. male who was admitted on 11/16 for evaluation of progressive generalized weakness for at least the past 6 months. History is taken from chart, as patient is comatose at the time of Neurological evaluation. Initial H and P documents that the patient had been evaluated for this in [place]. He was an inconsistent historian, changing the description of the course of progression of the weakness several times. He had a recent outpatient Neurology evaluation with MRI brain, c-spine and t-spine of 04/06/16 coming back unremarkable. EMG/NCS on 10/26 was abnormal with absent sensory responses to bilateral upper and lower extremities with severely decreased motor responses, as well as widespread moderate to severe spontaneous activity on electromyography. The outpatient neurological exam revealed profound distal arm weakness, moderate bilateral lower extremity proximal and distal weakness, preserved bilateral upper extremity and deep tendon reflexes but absent bilateral lower extremity deep tendon reflex. 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 A lumbar puncture was planned and several special laboratory studies were ordered.  He admitted on initial interview to chronic heavy alcohol consumption for at least the last 17 years. Scleral icterus and mild jaundiced coloration of skin were noted on admission.   Per nursing, his baseline yesterday was unreactive pupils, no response to facial stimulation, no doll's eye reflex and no spontaneous eye opening, with flaccid extremities that did not move to noxious stimuli.  Early this morning, he had a  seizure that was difficult to control. He has no known prior history of seizures. Per nursing note: "Patient suddenly having seizure activity at 0115 after Vasopressin started. Right arm and right leg contracting. Right side of face, head, and eye twitching. Patient desating in the 70s. Patient bagged via AmbuBag. Dr. Lake Bells cameraed in the room via [facility]. A total of 81m of Ativan given per MLake Bells MD orders. IV Keppra started. EEG order placed."  Seizure duration was approximately 5-10 minutes per discussion with Dr. MLake Bells   Past Medical History:  Diagnosis Date  . Alcohol abuse   . Hypertension     History reviewed. No pertinent surgical history.  Family History  Problem Relation Age of Onset  . Heart disease Mother   . Heart disease Father   . Hypertension Brother     Social History:  reports that he has never smoked. He has never used smokeless tobacco. He reports that he drinks about 46.2  oz of alcohol per week . He reports that he does not use drugs.  No Known Allergies  MEDICATIONS:                                                                                                                      Current Facility-Administered Medications:  .  chlorhexidine gluconate (MEDLINE KIT) (PERIDEX) 0.12 % solution 15 mL, 15 mL, Mouth Rinse, BID, Javier Glazier, MD, 15 mL at 04/25/16 2200 .  Chlorhexidine Gluconate Cloth 2 % PADS 6 each, 6 each, Topical, Q0600, Burgess Estelle, MD, 6 each at 04/25/16 0734 .  cholecalciferol (VITAMIN D) tablet 1,000 Units, 1,000 Units, Oral, Daily, Burgess Estelle, MD, 1,000 Units at 04/25/16 1051 .  dextrose 5 %-0.45 % sodium chloride infusion, , Intravenous, Continuous, Brand Males, MD, Last Rate: 75 mL/hr at 04/26/16 0313 .  fentaNYL (SUBLIMAZE) injection 25-100 mcg, 25-100 mcg, Intravenous, Q2H PRN, Anders Simmonds, MD, 50 mcg at 04/25/16 2237 .  folic acid (FOLVITE) tablet 1 mg, 1 mg, Oral, Daily, Javier Glazier, MD .  levETIRAcetam  (KEPPRA) 1,000 mg in sodium chloride 0.9 % 100 mL IVPB, 1,000 mg, Intravenous, Q12H, Juanito Doom, MD, 1,000 mg at 04/26/16 0134 .  LORazepam (ATIVAN) injection 2 mg, 2 mg, Intravenous, Q6H, Juanito Doom, MD, 2 mg at 04/26/16 0239 .  MEDLINE mouth rinse, 15 mL, Mouth Rinse, 10 times per day, Javier Glazier, MD, 15 mL at 04/26/16 0200 .  mupirocin ointment (BACTROBAN) 2 % 1 application, 1 application, Nasal, BID, Burgess Estelle, MD, 1 application at 69/79/48 2112 .  norepinephrine (LEVOPHED) 4 mg in dextrose 5 % 250 mL (0.016 mg/mL) infusion, 0-40 mcg/min, Intravenous, Titrated, Brand Males, MD, Last Rate: 150 mL/hr at 04/26/16 0304, 40 mcg/min at 04/26/16 0304 .  pantoprazole (PROTONIX) injection 40 mg, 40 mg, Intravenous, Daily, Lyndee Leo, RPH, 40 mg at 04/25/16 1052 .  piperacillin-tazobactam (ZOSYN) IVPB 3.375 g, 3.375 g, Intravenous, Q8H, Veronda P Bryk, RPH, 3.375 g at 04/25/16 2020 .  sodium bicarbonate 1 mEq/mL injection, , , ,  .  sodium chloride flush (NS) 0.9 % injection 10-40 mL, 10-40 mL, Intracatheter, Q12H, Javier Glazier, MD, 10 mL at 04/25/16 2112 .  sodium chloride flush (NS) 0.9 % injection 10-40 mL, 10-40 mL, Intracatheter, PRN, Javier Glazier, MD, 20 mL at 04/25/16 1052 .  sodium chloride flush (NS) 0.9 % injection 3 mL, 3 mL, Intravenous, Q12H, Burgess Estelle, MD, 3 mL at 04/25/16 2112 .  thiamine (VITAMIN B-1) tablet 100 mg, 100 mg, Oral, Daily, Javier Glazier, MD .  vancomycin (VANCOCIN) 1,250 mg in sodium chloride 0.9 % 250 mL IVPB, 1,250 mg, Intravenous, Q12H, Veronda P Bryk, RPH, 1,250 mg at 04/25/16 1755 .  vasopressin (PITRESSIN) 40 Units in sodium chloride 0.9 % 250 mL (0.16 Units/mL) infusion, 0.03 Units/min, Intravenous, Continuous, Juanito Doom, MD, Last Rate: 11.3 mL/hr at 04/26/16 0106, 0.03 Units/min at 04/26/16 0106    ROS:  Unable to obtain ROS due to coma.   Blood pressure 116/65, pulse (!) 125, temperature 98.9 F (37.2 C), temperature source Core (Comment), resp. rate (!) 32, height 5' 9.5" (1.765 m), weight 95.3 kg (210 lb 1.6 oz), SpO2 92 %.   General Examination:                                                                                                      HEENT-  Normocephalic/atraumatic. Jaundiced skin coloration and scleral icterus noted. Lungs- Intubated.  Extremities- Warm and well perfused.   Neurological Examination Mental Status: Comatose with GCS 3 on light fentanyl sedation.  Cranial Nerves: II: Pupils sluggishly reactive. No blink to threat. Does not close eyes in response to bright light.  III,IV, VI: ptosis not present, eyes at midline. No spontaneous eye movement. Absent doll's eye reflex.  V,VII: Flaccidly symmetric with no response to tactile stimuli. No corneal reflexes.  VIII: No response to auditory stimuli IX,X: Intubated XI: No movement XII: Intubated Motor/Sensory: Flaccid tone x 4 with no movement to noxious stimuli.  Deep Tendon Reflexes: Areflexic in upper and lower extremities. Plantars are mute.  Cerebellar/Gait: Unable to assess   Lab Results: Basic Metabolic Panel:  Recent Labs Lab 04/23/16 0100 04/23/16 0521 04/24/16 0624 04/25/16 0030 04/26/16 0006  NA 128* 129* 133* 136 136  K 3.8 3.6 3.8 4.3 3.8  CL 94* 95* 100* 102 105  CO2 20* 22 23 22  14*  GLUCOSE 135* 139* 133* 120* 111*  BUN 33* 31* 21* 25* 50*  CREATININE 1.48* 1.38* 1.12 1.51* 3.32*  CALCIUM 8.4* 8.5* 9.0 9.4 8.0*  MG  --   --   --  2.0 1.9  PHOS  --   --   --   --  3.1    Liver Function Tests:  Recent Labs Lab 04/15/2016 1637 04/23/16 0521 04/25/16 0030  AST 121* 112* 144*  ALT 52 50 54  ALKPHOS 55 52 65  BILITOT 3.3* 4.1* 5.7*  PROT 5.8* 5.7* 5.8*  ALBUMIN 3.3* 3.1* 3.0*    Recent Labs Lab 04/25/16 0030  LIPASE 33    Recent Labs Lab  05/02/2016 1637 04/26/16 0006  AMMONIA 76* 574*    CBC:  Recent Labs Lab 04/25/2016 2145  04/23/16 0737 04/23/16 1850 04/24/16 0624 04/25/16 0030 04/25/16 0100 04/26/16 0006  WBC 9.0  < > 9.0 7.9 12.4* 18.9*  --  9.1  NEUTROABS 6.0  --  6.1  --   --   --  17.6*  --   HGB 7.8*  < > 7.1* 6.5* 9.2* 10.5*  --  10.1*  HCT 21.5*  < > 19.6* 18.1* 26.1* 30.3*  --  30.4*  MCV 90.7  < > 90.3 91.0 89.1 92.1  --  94.4  PLT 110*  < > 97* 105* 122* 188  --  223  < > = values in this interval not displayed.  Cardiac Enzymes: No results for input(s): CKTOTAL, CKMB, CKMBINDEX, TROPONINI in the last 168 hours.  Lipid Panel: No results for input(s): CHOL, TRIG, HDL, CHOLHDL,  VLDL, LDLCALC in the last 168 hours.  CBG:  Recent Labs Lab 04/23/16 0740 04/23/16 1732 04/24/16 0753  GLUCAP 138* 127* 120*    Microbiology: Results for orders placed or performed during the hospital encounter of 05/06/2016  MRSA PCR Screening     Status: Abnormal   Collection Time: 04/23/16  4:51 AM  Result Value Ref Range Status   MRSA by PCR POSITIVE (A) NEGATIVE Final    Comment:        The GeneXpert MRSA Assay (FDA approved for NASAL specimens only), is one component of a comprehensive MRSA colonization surveillance program. It is not intended to diagnose MRSA infection nor to guide or monitor treatment for MRSA infections. RESULT CALLED TO, READ BACK BY AND VERIFIED WITH: P STRAMOSKI,RN AT 4709 04/23/16 BY L BENFIELD   Urine culture     Status: None   Collection Time: 04/24/16 10:12 AM  Result Value Ref Range Status   Specimen Description URINE, RANDOM  Final   Special Requests NONE  Final   Culture NO GROWTH  Final   Report Status 04/25/2016 FINAL  Final  Culture, blood (Routine X 2) w Reflex to ID Panel     Status: None (Preliminary result)   Collection Time: 04/24/16 10:35 AM  Result Value Ref Range Status   Specimen Description BLOOD RIGHT ANTECUBITAL  Final   Special Requests IN PEDIATRIC  BOTTLE 2CC  Final   Culture NO GROWTH 1 DAY  Final   Report Status PENDING  Incomplete  Culture, blood (Routine X 2) w Reflex to ID Panel     Status: None (Preliminary result)   Collection Time: 04/24/16 10:45 AM  Result Value Ref Range Status   Specimen Description BLOOD LEFT ANTECUBITAL  Final   Special Requests IN PEDIATRIC BOTTLE 2CC  Final   Culture  Setup Time   Final    GRAM POSITIVE COCCI IN CLUSTERS IN PEDIATRIC BOTTLE CRITICAL RESULT CALLED TO, READ BACK BY AND VERIFIED WITH: A MASTERS,PHARMD AT 1355 04/25/16 BY L BENFIELD    Culture GRAM POSITIVE COCCI  Final   Report Status PENDING  Incomplete  Blood Culture ID Panel (Reflexed)     Status: Abnormal   Collection Time: 04/24/16 10:45 AM  Result Value Ref Range Status   Enterococcus species NOT DETECTED NOT DETECTED Final   Listeria monocytogenes NOT DETECTED NOT DETECTED Final   Staphylococcus species DETECTED (A) NOT DETECTED Final    Comment: CRITICAL RESULT CALLED TO, READ BACK BY AND VERIFIED WITH: A MASTERS,PHARMD AT 1355 04/25/16 BY L BENFIELD    Staphylococcus aureus DETECTED (A) NOT DETECTED Final    Comment: CRITICAL RESULT CALLED TO, READ BACK BY AND VERIFIED WITH: A MASTERS,PHARMD AT 1355 04/25/16 BY L BENFIELD    Methicillin resistance NOT DETECTED NOT DETECTED Final   Streptococcus species NOT DETECTED NOT DETECTED Final   Streptococcus agalactiae NOT DETECTED NOT DETECTED Final   Streptococcus pneumoniae NOT DETECTED NOT DETECTED Final   Streptococcus pyogenes NOT DETECTED NOT DETECTED Final   Acinetobacter baumannii NOT DETECTED NOT DETECTED Final   Enterobacteriaceae species NOT DETECTED NOT DETECTED Final   Enterobacter cloacae complex NOT DETECTED NOT DETECTED Final   Escherichia coli NOT DETECTED NOT DETECTED Final   Klebsiella oxytoca NOT DETECTED NOT DETECTED Final   Klebsiella pneumoniae NOT DETECTED NOT DETECTED Final   Proteus species NOT DETECTED NOT DETECTED Final   Serratia marcescens NOT  DETECTED NOT DETECTED Final   Haemophilus influenzae NOT DETECTED NOT DETECTED Final  Neisseria meningitidis NOT DETECTED NOT DETECTED Final   Pseudomonas aeruginosa NOT DETECTED NOT DETECTED Final   Candida albicans NOT DETECTED NOT DETECTED Final   Candida glabrata NOT DETECTED NOT DETECTED Final   Candida krusei NOT DETECTED NOT DETECTED Final   Candida parapsilosis NOT DETECTED NOT DETECTED Final   Candida tropicalis NOT DETECTED NOT DETECTED Final  Culture, blood (routine x 2)     Status: None (Preliminary result)   Collection Time: 04/26/16 12:16 AM  Result Value Ref Range Status   Specimen Description BLOOD RIGHT ARM  Final   Special Requests BOTTLES DRAWN AEROBIC AND ANAEROBIC 5CC  Final   Culture PENDING  Incomplete   Report Status PENDING  Incomplete    Coagulation Studies:  Recent Labs  04/23/16 0737 04/24/16 0624 04/24/16 1042 04/25/16 0030  LABPROT 19.5* 17.7* 17.2* 17.6*  INR 1.63 1.44 1.39 1.44    Imaging: Ct Head Wo Contrast  Result Date: 04/24/2016 CLINICAL DATA:  Altered mental status EXAM: CT HEAD WITHOUT CONTRAST TECHNIQUE: Contiguous axial images were obtained from the base of the skull through the vertex without intravenous contrast. COMPARISON:  MRI 04/08/2016 FINDINGS: Brain: No acute intracranial abnormality. Specifically, no hemorrhage, hydrocephalus, mass lesion, acute infarction, or significant intracranial injury. Vascular: No hyperdense vessel or unexpected calcification. Skull: No acute calvarial abnormality Sinuses/Orbits: Visualized paranasal sinuses and mastoids clear. Orbital soft tissues unremarkable. Other: None IMPRESSION: No acute intracranial abnormality. Electronically Signed   By: Rolm Baptise M.D.   On: 04/24/2016 10:37   Ct Abdomen Pelvis W Contrast  Result Date: 04/24/2016 CLINICAL DATA:  Small bowel obstruction versus ileus. History of alcohol abuse. EXAM: CT ABDOMEN AND PELVIS WITH CONTRAST TECHNIQUE: Multidetector CT imaging of  the abdomen and pelvis was performed using the standard protocol following bolus administration of intravenous contrast. CONTRAST:  131m ISOVUE-300 IOPAMIDOL (ISOVUE-300) INJECTION 61% COMPARISON:  Abdominal radiograph 04/24/2016 FINDINGS: Lower chest: Bilateral small pleural effusions and associated bilateral lower lobe and right middle lobe atelectasis with air bronchograms. Hepatobiliary: There is a small amount of perihepatic ascites. The attenuation pattern of the liver is diffusely heterogeneous. Poor contrast opacification and streak artifact from the patient's right arm limit evaluation for small masses. Normal gallbladder. Pancreas: Normal pancreatic contours and enhancement. No peripancreatic fluid collection or pancreatic ductal dilatation. Spleen: Mild splenomegaly. Adrenals/Urinary Tract: Normal adrenal glands. No hydronephrosis or solid renal mass. Stomach/Bowel: The colon is diffusely dilated with associated air-fluid levels. There is no small bowel dilatation. There is no evidence of acute inflammation. There is ascitic fluid extending into the right lower quadrant. The appendix is not visualized. Vascular/Lymphatic: Normal course and caliber of the major abdominal vessels. No abdominal or pelvic adenopathy. Reproductive: Normal prostate and seminal vesicles. Musculoskeletal: No bony spinal canal stenosis. No lytic lesions. Normal visualized extrathoracic and extraperitoneal soft tissues. Other: No contributory non-categorized findings. IMPRESSION: 1. Diffusely dilated colon with multiple air-fluid levels. No associated small bowel dilatation. Together, the findings are most suggestive of adynamic ileus. 2. Heterogeneous attenuation pattern of the liver with nodular contours and ascites suggestive of hepatic cirrhosis. Non-urgent abdominal MRI may be helpful for further characterization, particularly to evaluate for underlying masses. 3. Bilateral small pleural effusions with bilateral lower lobe  and right middle lobe atelectasis. Electronically Signed   By: KUlyses JarredM.D.   On: 04/24/2016 23:29   Dg Chest Port 1 View  Result Date: 04/25/2016 CLINICAL DATA:  Encounter for central line placement EXAM: PORTABLE CHEST 1 VIEW COMPARISON:  Yesterday FINDINGS: Endotracheal tube  tip between the clavicular heads and carina. Left IJ central line with tip at the upper cavoatrial junction. An orogastric tube reaches the stomach. Low volume chest with bibasilar opacity. There is both collapse and consolidation on abdominal CT from yesterday IMPRESSION: 1. Unremarkable positioning of tubes and central line. 2. Low volume chest with bibasilar atelectasis and/or pneumonia. Electronically Signed   By: Monte Fantasia M.D.   On: 04/25/2016 07:07   Dg Chest Port 1 View  Result Date: 04/24/2016 CLINICAL DATA:  Acute onset of hypoxia.  Initial encounter. EXAM: PORTABLE CHEST 1 VIEW COMPARISON:  Chest radiograph performed 04/23/2016 FINDINGS: The lungs are hypoexpanded. Vascular congestion is noted. Increased interstitial markings may reflect mild interstitial edema. A small left pleural effusion is suggested. There is no evidence of pneumothorax. The cardiomediastinal silhouette is borderline normal in size. No acute osseous abnormalities are seen. IMPRESSION: Lungs hypoexpanded. Vascular congestion seen. Increased interstitial markings may reflect mild interstitial edema. Suggestion of small left pleural effusion. Electronically Signed   By: Garald Balding M.D.   On: 04/24/2016 21:21   Dg Abd Portable 1v  Result Date: 04/24/2016 CLINICAL DATA:  Acute onset of generalized abdominal distention. Initial encounter. EXAM: PORTABLE ABDOMEN - 1 VIEW COMPARISON:  Abdominal radiograph performed earlier today at 9:10 a.m. FINDINGS: The patient's enteric tube is noted coiling within the body of the stomach. The visualized bowel gas pattern is nonspecific, with diffusely distended loops of colon, likely reflecting mild  ileus. No free intra-abdominal air is seen, though evaluation for free air is limited on this semi-erect view. No acute osseous abnormalities are identified. IMPRESSION: 1. Enteric tube noted coiling within the body of the stomach. 2. Diffusely distended loops of colon likely reflect mild ileus. No free intra-abdominal air seen. Electronically Signed   By: Garald Balding M.D.   On: 04/24/2016 21:22   Dg Abd Portable 1v  Result Date: 04/24/2016 CLINICAL DATA:  NG tube placement. EXAM: PORTABLE ABDOMEN - 1 VIEW COMPARISON:  None. FINDINGS: NG tube is in place with the tip in the fundus of the stomach. There is mild gaseous distention of small bowel. Large volume of stool in the transverse colon is noted. IMPRESSION: NG tube in good position. Large colonic stool burden noted. Electronically Signed   By: Inge Rise M.D.   On: 04/24/2016 09:27    Assessment: 1. New onset seizure. Most likely secondary to EtOH withdrawal.  2. Coma. Most likely secondary to brainstem pathology or diffuse pathology involving the cerebral hemispheres. DDx includes central pontine myelinolysis +/- extrapontine myelinolysis, large brainstem stroke or hemorrhage or anoxic brain injury. Not likely due to benzodiazepine dosed for his seizure as nursing states that he was unresponsive yesterday as well.  3. Flaccid extremities and hyporeflexia. 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  4. Prior history of GBS.   Recommendations: 1. CT head now.  2. If CT unrevealing, obtain MRI brain without contrast.  3. STAT EEG monitoring.  4. Agree with Keppra load 1000 mg IV x 1. Scheduled dosing at 750 mg IV BID has been determined based upon the patient's calculated CrCl of 41 mL/min. 5. Continue scheduled Ativan 2 mg IV q6h for EtOH withdrawal. After 3 days, slowly begin tapering.  6.  Increase thiamine to 250 mg IV TID x 3 days, then continue at 100 mg IV or PO qd.   7. Discussed  with Dr. Lake Bells.   50 minutes of time spent in the evaluation and management of this critically ill patient.   Electronically signed: Dr. Kerney Elbe 04/26/2016, 4:06 AM

## 2016-04-26 NOTE — Progress Notes (Signed)
CRITICAL VALUE ALERT  Critical value received:  Lactic Acid 10.7  Date of notification:  04/26/2016  Time of notification:  0100  Critical value read back:Yes.    Nurse who received alert:  Owens LofflerKaziah Miller, RN  MD notified (1st page):  Kendrick FriesMcQuaid, MD  Time of first page:  0100  MD notified (2nd page):  Time of second page:  Responding MD:  Kendrick FriesMcQuaid, MD  Time MD responded:  0100

## 2016-04-26 NOTE — Progress Notes (Signed)
eLink Physician-Brief Progress Note Patient Name: Ryan FontJoseph Dedrick DOB: 05/11/1971 MRN: [id number]   Date of Service  04/26/2016  HPI/Events of Note  Seizures again Acid base status worsening> pH 7.10, oxygenation worse  eICU Interventions  Ativan 4mg  IV stat now, head CT per neurology orders Sodium bicarb gtt Increase PEEP 10     Intervention Category Major Interventions: Seizures - evaluation and management;Acid-Base disturbance - evaluation and management  [name] 04/26/2016, 4:19 AM

## 2016-04-26 NOTE — Progress Notes (Signed)
  Echocardiogram 2D Echocardiogram has been performed.  Ryan [name] 04/26/2016, 4:00 PM

## 2016-04-26 NOTE — Progress Notes (Signed)
0700: This tech checked on pt. Nurse stated he was too unstable to do an EEG at this time. 1420: Routine bedside EEG completed. Dr Lucia GaskinsAhern notified.

## 2016-04-26 NOTE — Progress Notes (Signed)
Patient Lactic Acid 10.7. Kendrick FriesMcQuaid, MD called and notified. Blood pressures low. Orders to give 1L NS blous and start patient on Vasopressin was given.

## 2016-04-26 NOTE — Progress Notes (Signed)
CRITICAL VALUE ALERT  Critical value received:  Lactic Acid 9.6  Date of notification:  05/02/2016    Time of notification:  0725  Critical value read back:Yes.    Nurse who received alert:  Owens LofflerKaziah Miller, RN  MD notified (1st page):  Pola CornELink, MD (secretary stated MDs or not in at the moment)  Time of first page:  0726  Will pass on critical to AM RN.

## 2016-04-26 NOTE — Progress Notes (Signed)
Pharmacy Antibiotic Note Ryan [name] is a 45 y.o. male admitted on 10/22/15 that is currently on day 2 of Zosyn and vancomycin for MSSA bacteremia and septic shock. Patient also noted with recurrent seizures.  SCr with sx increase overnight to 3.3 < 1.51 in setting of refractory shock.   Antimicrobials this admission:  11/19 zosyn 11/19 vanc>>  Microbiology results:  11/19 BCID: MSSA 11/18 BCx 2: MSSA 1/2 11/18 UCx: neg   Plan: -No zosyn changes -Hold vancomycin and check a random vancomycin level in am   Height: 5' 9.5" (176.5 cm) Weight: 229 lb 4.5 oz (104 kg) IBW/kg (Calculated) : 71.85  Temp (24hrs), Avg:99.6 F (37.6 C), Min:98.5 F (36.9 C), Max:102.3 F (39.1 C)   Recent Labs Lab 10-31-15 1652  04/23/16 0100 04/23/16 0521 04/23/16 0737 04/23/16 1850 04/24/16 0624 04/25/16 0030 04/25/16 0334 04/25/16 1955 04/26/16 0006 04/26/16 0620  WBC  --   < >  --  9.4 9.0 7.9 12.4* 18.9*  --   --  9.1  --   CREATININE  --   --  1.48* 1.38*  --   --  1.12 1.51*  --   --  3.32*  --   LATICACIDVEN 1.99*  --   --   --   --   --   --   --  5.2* 7.1* 10.1* 9.6*  < > = values in this interval not displayed.  Estimated Creatinine Clearance: 33.3 mL/min (by C-G formula based on SCr of 3.32 mg/dL (H)).    No Known Allergies  Ryan [name], Pharm D 04/26/2016 8:09 AM

## 2016-04-26 NOTE — Progress Notes (Signed)
eLink Physician-Brief Progress Note Patient Name: Ryan FontJoseph [name] DOB: 10/20/1970 MRN: [id number]   Date of Service  04/26/2016  HPI/Events of Note  Seizure Worsening hypoxemia  eICU Interventions  Increase versed to 25mg /hr Increase PEEP to 14     Intervention Category Major Interventions: Seizures - evaluation and management;Hypoxemia - evaluation and management  [name] 04/26/2016, 6:15 AM

## 2016-04-26 NOTE — Progress Notes (Signed)
eLink Physician-Brief Progress Note Patient Name: Ryan FontJoseph Seier DOB: 09/22/1970 MRN: [id number]   Date of Service  04/26/2016  HPI/Events of Note  Multiple Issues: 1. K+ = 5.2 and Creatinine = 3.96 and 2. ABG = 7.044/29.5/118.0/9 - PRVC rate already 30.  eICU Interventions  Will order: 1. Increase NaHCO3 IV infusion to 125 mL/hour. 2. NaHCO3 100 meq IV now.  3. ABG at 1 AM.  4. Kayexalate 30 gm via NGT now     Intervention Category Major Interventions: Acid-Base disturbance - evaluation and management;Electrolyte abnormality - evaluation and management  [name] Eugene 04/26/2016, 10:49 PM

## 2016-04-26 NOTE — Progress Notes (Addendum)
Patient suddenly having seizure activity at 0115 after Vasopressin started. Right arm and right leg contracting. Right side of face, head, and eye twitching. Patient desating in the 8080s. Patient bagged via Ambu[employer]. Dr. Kendrick FriesMcQuaid cameraed in the room via [facility]. A total of 8mg  of Ativan given per Kendrick FriesMcQuaid, MD orders. IV Keppra started. EEG order placed. Verbal order to get Femoral A-Line placed.

## 2016-04-26 NOTE — Progress Notes (Signed)
Pharmacy Antibiotic Note Maggie FontJoseph Dzikowski is a 45 y.o. male admitted on 2016/04/26 that is currently on day 2 of Zosyn and vancomycin for MSSA bacteremia and septic shock.   SCr with sx increase overnight to 3.3 < 1.51 in setting of refractory shock.    Plan: 1. Zosyn 3.375g IV q8h (4 hour infusion).  2. Based on decline in renal function will empirically adjust vancomycin to 1 gram IV every 24 hours   Height: 5' 9.5" (176.5 cm) Weight: 210 lb 1.6 oz (95.3 kg) IBW/kg (Calculated) : 71.85  Temp (24hrs), Avg:99.6 F (37.6 C), Min:98.5 F (36.9 C), Max:102.3 F (39.1 C)   Recent Labs Lab 02/20/2016 1652  04/23/16 0100 04/23/16 0521 04/23/16 0737 04/23/16 1850 04/24/16 0624 04/25/16 0030 04/25/16 0334 04/25/16 1955 04/26/16 0006  WBC  --   < >  --  9.4 9.0 7.9 12.4* 18.9*  --   --  9.1  CREATININE  --   --  1.48* 1.38*  --   --  1.12 1.51*  --   --  3.32*  LATICACIDVEN 1.99*  --   --   --   --   --   --   --  5.2* 7.1* 10.1*  < > = values in this interval not displayed.  Estimated Creatinine Clearance: 32 mL/min (by C-G formula based on SCr of 3.32 mg/dL (H)).    No Known Allergies   Pollyann SamplesAndy Yanira Tolsma, PharmD, BCPS 04/26/2016, 4:27 AM Pager: [phone]

## 2016-04-26 NOTE — Progress Notes (Signed)
Pt desating 83% on fio2 100% and peep of 16, called Dr. Isaiah SergeMannam orders to give 95mg  roceronium @0730  once. Sats @ 0800 92%, Dr. Tyson AliasFeinstein @ bedside. Will continue to monitor closely.

## 2016-04-26 NOTE — Progress Notes (Signed)
Patient HR dropped in the 40s, blood pressures with a MAP of 40. Patient drop sats to 91%. ELink called and notified. MD will call patients family to determine plan of care. Will continue to monitor patient.

## 2016-04-26 NOTE — Progress Notes (Signed)
[facility]    Date of Admission:  05/06/2016   Total days of antibiotics 2        Day 2 piptazo        Day 2 vanco        Day 1 rifaxamin   ID: Ryan [name] is a 45 y.o. male with ETOH cirrhosis, with AIDP, who became increasing lethargic and septic with MSSA bacteremia.  He currently is intbuated on levophed.  CT abd with dilated colon, cirrhosis.    Active Problems:   Anemia   Acute respiratory failure with hypoxemia (HCC)   Shock circulatory (HCC)   Lactic acidosis   Acute respiratory acidosis   Altered mental status   Confusion   Respiratory distress   Somnolence    Subjective: Afebrile at temp of 100.1. He had EEG to evaluate new onset seizure. 2 D echo performed this afternoon  Medications:  . artificial tears  1 application Both Eyes I4P  . chlorhexidine gluconate (MEDLINE KIT)  15 mL Mouth Rinse BID  . Chlorhexidine Gluconate Cloth  6 each Topical Q0600  . cholecalciferol  1,000 Units Oral Daily  . cisatracurium  0.1 mg/kg Intravenous Once  . fentaNYL (SUBLIMAZE) injection  100 mcg Intravenous Once  . folic acid  1 mg Oral Daily  . hydrocortisone sod succinate (SOLU-CORTEF) inj  50 mg Intravenous Q6H  . lactulose  30 g Per Tube Q6H  . levETIRAcetam  1,000 mg Intravenous Q12H  . mouth rinse  15 mL Mouth Rinse 10 times per day  . mupirocin ointment  1 application Nasal BID  . pantoprazole (PROTONIX) IV  40 mg Intravenous Daily  . piperacillin-tazobactam (ZOSYN)  IV  3.375 g Intravenous Q8H  . rifaximin  550 mg Oral Q8H  . sodium chloride flush  10-40 mL Intracatheter Q12H  . sodium chloride flush  3 mL Intravenous Q12H  . thiamine  100 mg Oral Daily    Objective: Vital signs in last 24 hours: Temp:  [98.9 F (37.2 C)-102.3 F (39.1 C)] 100 F (37.8 C) (11/20 1200) Pulse Rate:  [97-147] 122 (11/20 1541) Resp:  [21-36] 35 (11/20 1541) BP: (66-121)/(37-66) 115/40 (11/20 1541) SpO2:  [77 %-100 %] 97 % (11/20 1541) Arterial Line  BP: (86-163)/(34-74) 119/43 (11/20 1500) FiO2 (%):  [80 %-100 %] 90 % (11/20 1541) Weight:  [229 lb 4.5 oz (104 kg)] 229 lb 4.5 oz (104 kg) (11/20 0600)  Physical Exam  Constitutional: He is intubated, sedated,jaundiced HENT: +scleral icteral, conjunctival edema. OETT in place Cardiovascular: tachy, regular rhythm and normal heart sounds. Exam reveals no gallop and no friction rub.  No murmur heard.  Pulmonary/Chest: Effort normal and breath sounds normal. No respiratory distress. He has no wheezes.  Abdominal: BS are absent.He exhibits distension. There is no tenderness.   Ext: trace edema Skin: jaundiced Psychiatric: sedated  Lab Results  Recent Labs  04/26/16 0006 04/26/16 0806  WBC 9.1 12.9*  HGB 10.1* 8.9*  HCT 30.4* 26.5*  NA 136 133*  K 3.8 4.1  CL 105 104  CO2 14* 10*  BUN 50* 53*  CREATININE 3.32* 3.58*   Liver Panel  Recent Labs  04/25/16 0030 04/26/16 0806  PROT 5.8* 4.4*  ALBUMIN 3.0* 1.9*  AST 144* 142*  ALT 54 45  ALKPHOS 65 60  BILITOT 5.7* 4.6*  BILIDIR  --  2.7*  IBILI  --  1.9*    Microbiology: 11/18: MSSA in 1 of 2 sets of  blood cx 11/20 blood cx pending Studies/Results: Ct Head Wo Contrast  Result Date: 04/26/2016 CLINICAL DATA:  Seizure.  Possible withdrawal. EXAM: CT HEAD WITHOUT CONTRAST TECHNIQUE: Contiguous axial images were obtained from the base of the skull through the vertex without intravenous contrast. COMPARISON:  Head CT 04/24/2016 FINDINGS: Brain: There is poor gray-white differentiation, worsened from the prior study. There has been interval narrowing of the lateral and third ventricles and basal cisterns. There is no acute intracranial hemorrhage or CT evidence of acute infarct. No midline shift or frank herniation. Vascular: No hyperdense vessel or unexpected calcification. Skull: Normal visualized skull base, calvarium and extracranial soft tissues. Sinuses/Orbits: No sinus fluid levels or advanced mucosal thickening. No  mastoid effusion. Normal orbits. IMPRESSION: 1. Worsening gray-white distinction with associated progressive narrowing of the ventricles and basal cisterns is consistent with diffuse cerebral edema. 2. No acute hemorrhage or CT evidence of acute infarct. Electronically Signed   By: Ulyses Jarred M.D.   On: 04/26/2016 06:02   Ct Abdomen Pelvis W Contrast  Result Date: 04/24/2016 CLINICAL DATA:  Small bowel obstruction versus ileus. History of alcohol abuse. EXAM: CT ABDOMEN AND PELVIS WITH CONTRAST TECHNIQUE: Multidetector CT imaging of the abdomen and pelvis was performed using the standard protocol following bolus administration of intravenous contrast. CONTRAST:  142m ISOVUE-300 IOPAMIDOL (ISOVUE-300) INJECTION 61% COMPARISON:  Abdominal radiograph 04/24/2016 FINDINGS: Lower chest: Bilateral small pleural effusions and associated bilateral lower lobe and right middle lobe atelectasis with air bronchograms. Hepatobiliary: There is a small amount of perihepatic ascites. The attenuation pattern of the liver is diffusely heterogeneous. Poor contrast opacification and streak artifact from the patient's right arm limit evaluation for small masses. Normal gallbladder. Pancreas: Normal pancreatic contours and enhancement. No peripancreatic fluid collection or pancreatic ductal dilatation. Spleen: Mild splenomegaly. Adrenals/Urinary Tract: Normal adrenal glands. No hydronephrosis or solid renal mass. Stomach/Bowel: The colon is diffusely dilated with associated air-fluid levels. There is no small bowel dilatation. There is no evidence of acute inflammation. There is ascitic fluid extending into the right lower quadrant. The appendix is not visualized. Vascular/Lymphatic: Normal course and caliber of the major abdominal vessels. No abdominal or pelvic adenopathy. Reproductive: Normal prostate and seminal vesicles. Musculoskeletal: No bony spinal canal stenosis. No lytic lesions. Normal visualized extrathoracic and  extraperitoneal soft tissues. Other: No contributory non-categorized findings. IMPRESSION: 1. Diffusely dilated colon with multiple air-fluid levels. No associated small bowel dilatation. Together, the findings are most suggestive of adynamic ileus. 2. Heterogeneous attenuation pattern of the liver with nodular contours and ascites suggestive of hepatic cirrhosis. Non-urgent abdominal MRI may be helpful for further characterization, particularly to evaluate for underlying masses. 3. Bilateral small pleural effusions with bilateral lower lobe and right middle lobe atelectasis. Electronically Signed   By: KUlyses JarredM.D.   On: 04/24/2016 23:29   Dg Chest Port 1 View  Result Date: 04/26/2016 CLINICAL DATA:  Respiratory failure. EXAM: PORTABLE CHEST 1 VIEW COMPARISON:  04/25/2016 . FINDINGS: Endotracheal tube, NG tube, left IJ line stable position. Heart size stable. Dense bilateral pulmonary infiltrates are noted with dense basilar atelectasis. No pleural effusion. Patch that small pleural effusions cannot be excluded. No pneumothorax . IMPRESSION: 1.  Lines and tubes in stable position. 2. Interim appearance of dense bilateral pulmonary infiltrates. Dense basilar atelectasis also present. Small bilateral pleural effusions cannot be excluded . Electronically Signed   By: TMarcello Moores Register   On: 04/26/2016 07:26   Dg Chest Port 1 View  Result Date: 04/25/2016 CLINICAL DATA:  Encounter for central line placement EXAM: PORTABLE CHEST 1 VIEW COMPARISON:  Yesterday FINDINGS: Endotracheal tube tip between the clavicular heads and carina. Left IJ central line with tip at the upper cavoatrial junction. An orogastric tube reaches the stomach. Low volume chest with bibasilar opacity. There is both collapse and consolidation on abdominal CT from yesterday IMPRESSION: 1. Unremarkable positioning of tubes and central line. 2. Low volume chest with bibasilar atelectasis and/or pneumonia. Electronically Signed   By:  Monte Fantasia M.D.   On: 04/25/2016 07:07   Dg Chest Port 1 View  Result Date: 04/24/2016 CLINICAL DATA:  Acute onset of hypoxia.  Initial encounter. EXAM: PORTABLE CHEST 1 VIEW COMPARISON:  Chest radiograph performed 04/23/2016 FINDINGS: The lungs are hypoexpanded. Vascular congestion is noted. Increased interstitial markings may reflect mild interstitial edema. A small left pleural effusion is suggested. There is no evidence of pneumothorax. The cardiomediastinal silhouette is borderline normal in size. No acute osseous abnormalities are seen. IMPRESSION: Lungs hypoexpanded. Vascular congestion seen. Increased interstitial markings may reflect mild interstitial edema. Suggestion of small left pleural effusion. Electronically Signed   By: Garald Balding M.D.   On: 04/24/2016 21:21   Dg Abd Portable 1v  Result Date: 04/26/2016 CLINICAL DATA:  45 year old male with ileus.  Subsequent encounter. EXAM: PORTABLE ABDOMEN - 1 VIEW COMPARISON:  04/24/2016 CT and plain film exam. FINDINGS: Gas-filled colon with slightly thickened folds. Decrease in gas filled small bowel loops. Cannot exclude fluid-filled loops of small bowel. Foley catheter. The possibility of free intraperitoneal air cannot be assessed on a supine view. No acute osseous abnormality IMPRESSION: Clearing of previously noted gas filled small bowel loops. Slightly thickened colonic folds. Electronically Signed   By: Genia Del M.D.   On: 04/26/2016 06:44   Dg Abd Portable 1v  Result Date: 04/24/2016 CLINICAL DATA:  Acute onset of generalized abdominal distention. Initial encounter. EXAM: PORTABLE ABDOMEN - 1 VIEW COMPARISON:  Abdominal radiograph performed earlier today at 9:10 a.m. FINDINGS: The patient's enteric tube is noted coiling within the body of the stomach. The visualized bowel gas pattern is nonspecific, with diffusely distended loops of colon, likely reflecting mild ileus. No free intra-abdominal air is seen, though evaluation  for free air is limited on this semi-erect view. No acute osseous abnormalities are identified. IMPRESSION: 1. Enteric tube noted coiling within the body of the stomach. 2. Diffusely distended loops of colon likely reflect mild ileus. No free intra-abdominal air seen. Electronically Signed   By: Garald Balding M.D.   On: 04/24/2016 21:22     Assessment/Plan: 45yo M with ETOH cirrhosis admitted with AMS, seizures, hypoxia requiring intubation and vasopressors for sepsis, he was found to have MSSA bacteremia but still requiring ventilator support for respiratory failure due to presumed aspiration pneumonia with infiltrates on cXR in today's xray  - continue on vancomycin and piptazo for now - recommend to get respiratory cutlure to see if any MRSA isolated. If not, can d/c vancomycin and minimized AKI  MSSA bacteremia = would be covered by piptazo. Had 2D echo to evaluate for endocarditis. He has multiple end organ damage, await to see results of 2 D to decide if TEE needed  AKI = thought to be due to ATN, vs if hepato-renal process is involved. Continue on CRRT  Hepatic encephalopathy = currently on rifaxamin. Had been on lactulose, failed therapy. though has ileus, thus may need rectal tube to see if helps with decompression vs. Doing lactulose enema.  Overall prognosis is poor.  Spent 40 min with patient's family discussing his prognosis. The patient's brother is attempting to fly to town tomorrow from [place]. Parents, sister, daughter, ex-wife in the room    [place], [facility] for Infectious Diseases Cell: [phone] Pager: [phone]  04/26/2016, 4:14 PM

## 2016-04-26 NOTE — Progress Notes (Signed)
Neuro at bedside.

## 2016-04-26 NOTE — Progress Notes (Signed)
   04/26/16 0936  Clinical Encounter Type  Visited With Other (Comment) (Catholic priest is in attendance with patient,per Amy Munso)  [facility]

## 2016-04-26 NOTE — Progress Notes (Signed)
Patient transferred to CT with one RN and two RT. Patient stable during transport.

## 2016-04-26 NOTE — Discharge Instructions (Signed)

## 2016-04-26 NOTE — Progress Notes (Signed)
eLink Physician-Brief Progress Note Patient Name: Maggie FontJoseph Valencia DOB: 11/01/1970 MRN: [id number]   Date of Service  04/26/2016  HPI/Events of Note  Bradycardia and Hypotension On maximum vasopressors Worsening acidosis Condition well known to me from caring for him last night He will not survive  I called his father Gabriel RungJoe to explain that there is nothing more we can do.  I will not titrate up medicines or draw more labs as this will not provide medical benefit.  The father voiced understanding and thanked me for our care.  He stated that they are hoping for his brother to make it in tomorrow, but if he doesn't make it through the night he understands.   eICU Interventions  Informed nurse to not titrate meds further, no more labs.       Intervention Category Major Interventions: End of life / care limitation discussion  [name] 04/26/2016, 11:40 PM

## 2016-04-26 NOTE — Progress Notes (Signed)
Family called and updated by Dr. Tyson AliasFeinstein, called chaplain. Will continue to give emotional support. Family @ bedside.

## 2016-04-26 NOTE — Progress Notes (Signed)
eLink Physician-Brief Progress Note Patient Name: Maggie FontJoseph Debruyne DOB: 02/13/1971 MRN: [id number]   Date of Service  04/26/2016  HPI/Events of Note  Persistent hypoxemia despite increased PEEP On camera check, dyssynchrony noted  eICU Interventions  Rocuronium, continue versed gtt for sedation, will need EEG (tech has been called by neuro) CXR     Intervention Category Major Interventions: Hypoxemia - evaluation and management  [name] 04/26/2016, 6:22 AM

## 2016-04-26 NOTE — Progress Notes (Signed)
eLink Physician-Brief Progress Note Patient Name: Ryan FontJoseph Holaway DOB: 06/14/1970 MRN: [id number]   Date of Service  04/26/2016  HPI/Events of Note  Shock persists, increasing levophed doses  eICU Interventions  Add vasopressin     Intervention Category Major Interventions: Shock - evaluation and management  Max FickleDouglas Macarius Ruark 04/26/2016, 12:53 AM

## 2016-04-26 NOTE — Progress Notes (Signed)
Verbal order to give 1L NS fluid bolus per Isaiah SergeMannam, MD

## 2016-04-26 NOTE — Progress Notes (Addendum)
PULMONARY / CRITICAL CARE MEDICINE   Name: Ryan [name] MRN: [id number] DOB: 25-Oct-1970    ADMISSION DATE:  21-May-2016 CONSULTATION DATE:  04/23/16  REFERRING MD:  Richardo Priest MD  CHIEF COMPLAINT: Altered mental status, hypoxia.  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 This weakness has been progressive over the past 6 months. He's had an initial evaluation in [place]. He has had workup including MRI, EMG, and extensive serologies sent. He experienced a desaturation event on 11/17 and was evaluated by PCCM. At that time it was felt he was stable with supplemental oxygen and able to protect his airway. Since then, he has developed an ileus with 1400cc out of an NGT, his work of breathing has increase and his hypoxemia has worsened to the point that his pO2 is 71 on NRB. He is minimally responsive to noxious stimuli and I doubt his ability to protect his airway. He has also become somewhat hypotensive with systolics in the 80s (prior baseline was 110s to 130s). He is unable to answer any questions for me and only grimaces to noxious stimuli. GCS 7 (1, 1, 5)  04/25/2016 - dad gave written hx of constipation x 3 weeks and 1-2 weeks of pain in lower extremities and hallucination and delusion prior to admission associated with confusion, falls and inability to feed. Also reported inability to urinate since pm of 05-21-16 prior to admit  EVENTS 2016/05/21  - admit under IMTS. AMMONIA 76 11/17 - pccm consult - Development of ileus, worsening mental status, worsening hypoxemia 04/25/16 - move to ICU and intubatd and CVL placed. PCCM primary  SUBJECTIVE:  Refractory acidosis, shock, MODS  VITAL SIGNS: BP (!) 102/41   Pulse (!) 133   Temp 100.1 F (37.8 C) (Axillary)   Resp (!) 30   Ht 5' 9.5" (1.765 m)   Wt 104 kg (229 lb 4.5 oz)   SpO2 (!) 89%   BMI 33.37 kg/m   HEMODYNAMICS: CVP:   [3 mmHg-10 mmHg] 8 mmHg  VENTILATOR SETTINGS: Vent Mode: PRVC FiO2 (%):  [60 %-100 %] 100 % Set Rate:  [18 bmp-30 bmp] 30 bmp Vt Set:  [580 mL] 580 mL PEEP:  [5 cmH20-16 cmH20] 16 cmH20 Plateau Pressure:  [22 cmH20-36 cmH20] 36 cmH20  INTAKE / OUTPUT: I/O last 3 completed shifts: In: 8798.2 [I.V.:3888.2; Other:100; NG/GT:200; IV Piggyback:4610] Out: 2665 [Urine:615; Emesis/NG output:2050]  PHYSICAL EXAMINATION:  General intubaed and sedated  HEENT No gross abnormalities. NGT in place.    Pulmonary Coarse ronchi  Cardiovascular  st s1 s2 rr  Abdomen Soft, distendedhypoactive bowel sounds, no palpable organomegaly or masses  Musculoskeletal Grossly normal  Lymphatics No cervical, supraclavicular or axillary adenopathy.   Neurologic RASS -5, now paralyzed  Skin/Integuement No rash, no cyanosis, no clubbing. Trace bipedal edema.     LABS: PULMONARY  Recent Labs Lab 04/24/16 2026 04/25/16 0403 04/25/16 0615 04/25/16 1802 04/25/16 1935 04/26/16 0412  PHART 7.358 7.424 7.385 7.235* 7.319* 7.101*  PCO2ART 43.7 30.0* 32.3 37.5 33.0 33.2  PO2ART 61.0* 72.0* 79.3* 66.0* 93.0 63.0*  HCO3 24.6 19.6* 18.9* 15.5* 16.6* 10.3*  TCO2 26 20  --  16 18 11   O2SAT 90.0 95.0 94.6 85.0 96.0 83.0    CBC  Recent Labs Lab 04/24/16 0624 04/25/16 0030 04/26/16 0006  HGB 9.2* 10.5* 10.1*  HCT 26.1* 30.3* 30.4*  WBC 12.4* 18.9* 9.1  PLT 122* 188 223    COAGULATION  Recent Labs  Lab 04/25/2016 1637 04/23/16 0737 04/24/16 0624 04/24/16 1042 04/25/16 0030  INR 1.58 1.63 1.44 1.39 1.44    CARDIAC  No results for input(s): TROPONINI in the last 168 hours. No results for input(s): PROBNP in the last 168 hours.   CHEMISTRY  Recent Labs Lab 04/23/16 0100 04/23/16 0521 04/24/16 0624 04/25/16 0030 04/26/16 0006  NA 128* 129* 133* 136 136  K 3.8 3.6 3.8 4.3 3.8  CL 94* 95* 100* 102 105  CO2 20* 22 23 22  14*  GLUCOSE 135* 139* 133* 120* 111*  BUN 33* 31* 21* 25* 50*   CREATININE 1.48* 1.38* 1.12 1.51* 3.32*  CALCIUM 8.4* 8.5* 9.0 9.4 8.0*  MG  --   --   --  2.0 1.9  PHOS  --   --   --   --  3.1   Estimated Creatinine Clearance: 33.3 mL/min (by C-G formula based on SCr of 3.32 mg/dL (H)).   LIVER  Recent Labs Lab 04/12/2016 1637 04/23/16 0521 04/23/16 0737 04/24/16 0624 04/24/16 1042 04/25/16 0030  AST 121* 112*  --   --   --  144*  ALT 52 50  --   --   --  54  ALKPHOS 55 52  --   --   --  65  BILITOT 3.3* 4.1*  --   --   --  5.7*  PROT 5.8* 5.7*  --   --   --  5.8*  ALBUMIN 3.3* 3.1*  --   --   --  3.0*  INR 1.58  --  1.63 1.44 1.39 1.44     INFECTIOUS  Recent Labs Lab 04/25/16 0334 04/25/16 1955 04/26/16 0006 04/26/16 0620  LATICACIDVEN 5.2* 7.1* 10.1* 9.6*  PROCALCITON 0.88  --   --   --      ENDOCRINE CBG (last 3)   Recent Labs  04/23/16 1732 04/24/16 0753  GLUCAP 127* 120*      IMAGING x48h  - image(s) personally visualized  -   highlighted in bold Ct Head Wo Contrast  Result Date: 04/26/2016 CLINICAL DATA:  Seizure.  Possible withdrawal. EXAM: CT HEAD WITHOUT CONTRAST TECHNIQUE: Contiguous axial images were obtained from the base of the skull through the vertex without intravenous contrast. COMPARISON:  Head CT 04/24/2016 FINDINGS: Brain: There is poor gray-white differentiation, worsened from the prior study. There has been interval narrowing of the lateral and third ventricles and basal cisterns. There is no acute intracranial hemorrhage or CT evidence of acute infarct. No midline shift or frank herniation. Vascular: No hyperdense vessel or unexpected calcification. Skull: Normal visualized skull base, calvarium and extracranial soft tissues. Sinuses/Orbits: No sinus fluid levels or advanced mucosal thickening. No mastoid effusion. Normal orbits. IMPRESSION: 1. Worsening gray-white distinction with associated progressive narrowing of the ventricles and basal cisterns is consistent with diffuse cerebral edema. 2. No  acute hemorrhage or CT evidence of acute infarct. Electronically Signed   By: Deatra RobinsonKevin  Herman M.D.   On: 04/26/2016 06:02   Ct Head Wo Contrast  Result Date: 04/24/2016 CLINICAL DATA:  Altered mental status EXAM: CT HEAD WITHOUT CONTRAST TECHNIQUE: Contiguous axial images were obtained from the base of the skull through the vertex without intravenous contrast. COMPARISON:  MRI 04/08/2016 FINDINGS: Brain: No acute intracranial abnormality. Specifically, no hemorrhage, hydrocephalus, mass lesion, acute infarction, or significant intracranial injury. Vascular: No hyperdense vessel or unexpected calcification. Skull: No acute calvarial abnormality Sinuses/Orbits: Visualized paranasal sinuses and mastoids clear. Orbital soft tissues unremarkable. Other: None  IMPRESSION: No acute intracranial abnormality. Electronically Signed   By: Charlett Nose M.D.   On: 04/24/2016 10:37   Ct Abdomen Pelvis W Contrast  Result Date: 04/24/2016 CLINICAL DATA:  Small bowel obstruction versus ileus. History of alcohol abuse. EXAM: CT ABDOMEN AND PELVIS WITH CONTRAST TECHNIQUE: Multidetector CT imaging of the abdomen and pelvis was performed using the standard protocol following bolus administration of intravenous contrast. CONTRAST:  ISOVUE-300 IOPAMIDOL (ISOVUE-300) INJECTION 61% COMPARISON:  Abdominal radiograph 04/24/2016 FINDINGS: Lower chest: Bilateral small pleural effusions and associated bilateral lower lobe and right middle lobe atelectasis with air bronchograms. Hepatobiliary: There is a small amount of perihepatic ascites. The attenuation pattern of the liver is diffusely heterogeneous. Poor contrast opacification and streak artifact from the patient's right arm limit evaluation for small masses. Normal gallbladder. Pancreas: Normal pancreatic contours and enhancement. No peripancreatic fluid collection or pancreatic ductal dilatation. Spleen: Mild splenomegaly. Adrenals/Urinary Tract: Normal adrenal glands. No  hydronephrosis or solid renal mass. Stomach/Bowel: The colon is diffusely dilated with associated air-fluid levels. There is no small bowel dilatation. There is no evidence of acute inflammation. There is ascitic fluid extending into the right lower quadrant. The appendix is not visualized. Vascular/Lymphatic: Normal course and caliber of the major abdominal vessels. No abdominal or pelvic adenopathy. Reproductive: Normal prostate and seminal vesicles. Musculoskeletal: No bony spinal canal stenosis. No lytic lesions. Normal visualized extrathoracic and extraperitoneal soft tissues. Other: No contributory non-categorized findings. IMPRESSION: 1. Diffusely dilated colon with multiple air-fluid levels. No associated small bowel dilatation. Together, the findings are most suggestive of adynamic ileus. 2. Heterogeneous attenuation pattern of the liver with nodular contours and ascites suggestive of hepatic cirrhosis. Non-urgent abdominal MRI may be helpful for further characterization, particularly to evaluate for underlying masses. 3. Bilateral small pleural effusions with bilateral lower lobe and right middle lobe atelectasis. Electronically Signed   By: Deatra Robinson M.D.   On: 04/24/2016 23:29   Dg Chest Port 1 View  Result Date: 04/26/2016 CLINICAL DATA:  Respiratory failure. EXAM: PORTABLE CHEST 1 VIEW COMPARISON:  04/25/2016 . FINDINGS: Endotracheal tube, NG tube, left IJ line stable position. Heart size stable. Dense bilateral pulmonary infiltrates are noted with dense basilar atelectasis. No pleural effusion. Patch that small pleural effusions cannot be excluded. No pneumothorax . IMPRESSION: 1.  Lines and tubes in stable position. 2. Interim appearance of dense bilateral pulmonary infiltrates. Dense basilar atelectasis also present. Small bilateral pleural effusions cannot be excluded . Electronically Signed   By: Maisie Fus  Register   On: 04/26/2016 07:26   Dg Chest Port 1 View  Result Date:  04/25/2016 CLINICAL DATA:  Encounter for central line placement EXAM: PORTABLE CHEST 1 VIEW COMPARISON:  Yesterday FINDINGS: Endotracheal tube tip between the clavicular heads and carina. Left IJ central line with tip at the upper cavoatrial junction. An orogastric tube reaches the stomach. Low volume chest with bibasilar opacity. There is both collapse and consolidation on abdominal CT from yesterday IMPRESSION: 1. Unremarkable positioning of tubes and central line. 2. Low volume chest with bibasilar atelectasis and/or pneumonia. Electronically Signed   By: Marnee Spring M.D.   On: 04/25/2016 07:07   Dg Chest Port 1 View  Result Date: 04/24/2016 CLINICAL DATA:  Acute onset of hypoxia.  Initial encounter. EXAM: PORTABLE CHEST 1 VIEW COMPARISON:  Chest radiograph performed 04/23/2016 FINDINGS: The lungs are hypoexpanded. Vascular congestion is noted. Increased interstitial markings may reflect mild interstitial edema. A small left pleural effusion is suggested. There is no evidence of  pneumothorax. The cardiomediastinal silhouette is borderline normal in size. No acute osseous abnormalities are seen. IMPRESSION: Lungs hypoexpanded. Vascular congestion seen. Increased interstitial markings may reflect mild interstitial edema. Suggestion of small left pleural effusion. Electronically Signed   By: Roanna RaiderJeffery  Chang M.D.   On: 04/24/2016 21:21   Dg Abd Portable 1v  Result Date: 04/26/2016 CLINICAL DATA:  45 year old male with ileus.  Subsequent encounter. EXAM: PORTABLE ABDOMEN - 1 VIEW COMPARISON:  04/24/2016 CT and plain film exam. FINDINGS: Gas-filled colon with slightly thickened folds. Decrease in gas filled small bowel loops. Cannot exclude fluid-filled loops of small bowel. Foley catheter. The possibility of free intraperitoneal air cannot be assessed on a supine view. No acute osseous abnormality IMPRESSION: Clearing of previously noted gas filled small bowel loops. Slightly thickened colonic folds.  Electronically Signed   By: Lacy DuverneySteven  Olson M.D.   On: 04/26/2016 06:44   Dg Abd Portable 1v  Result Date: 04/24/2016 CLINICAL DATA:  Acute onset of generalized abdominal distention. Initial encounter. EXAM: PORTABLE ABDOMEN - 1 VIEW COMPARISON:  Abdominal radiograph performed earlier today at 9:10 a.m. FINDINGS: The patient's enteric tube is noted coiling within the body of the stomach. The visualized bowel gas pattern is nonspecific, with diffusely distended loops of colon, likely reflecting mild ileus. No free intra-abdominal air is seen, though evaluation for free air is limited on this semi-erect view. No acute osseous abnormalities are identified. IMPRESSION: 1. Enteric tube noted coiling within the body of the stomach. 2. Diffusely distended loops of colon likely reflect mild ileus. No free intra-abdominal air seen. Electronically Signed   By: Roanna RaiderJeffery  Chang M.D.   On: 04/24/2016 21:22   Dg Abd Portable 1v  Result Date: 04/24/2016 CLINICAL DATA:  NG tube placement. EXAM: PORTABLE ABDOMEN - 1 VIEW COMPARISON:  None. FINDINGS: NG tube is in place with the tip in the fundus of the stomach. There is mild gaseous distention of small bowel. Large volume of stool in the transverse colon is noted. IMPRESSION: NG tube in good position. Large colonic stool burden noted. Electronically Signed   By: Drusilla Kannerhomas  Dalessio M.D.   On: 04/24/2016 09:27    DISCUSSION 11/19 Pt with subacute progressive axonal sensorimotor polyneuropathy, anemia, encephalopathy in setting of chronic alcohol use. Previous change in mental status was attributed to medication effect in the setting of hepatic encephalopathy. He may be more sensitive to sedating meds. Extensive workup by neurology in process.  Since initial PCCM evaluation, patient has continued to deteriorate with increasing oxygen requirements, interval development of ileus, worsening mental status, worsening renal function, and hypotension. Patient will move to ICU for  intubation, central line placement and further monitoring.  ASSESSMENT/PLAN  RESP A:  acute hypoxemic respiratory failure due to acute encephalopathy Refractory acidosis ARDS Presumed Asp PNA P - full vent support - abg stat, rate to 35 -ARDS, ph less 7.25, maintain bicarb drip -pcxr in am  -needs paralysis -if able to 7 cc/kg, keep plat less 30 -was 7.1 lit pos , need to limit such gross pos balance -requires paralysis to ventilate  CVS A: Circulatory shock  Refractory- septic? Adrenal insuff P Fluid bolus and reassess- we have maxed this, getting another liter now, that will be 10 liters in 24 hours., awaiu cvp LEvophed for mAP > 60 Ensure stress steroids Vaso maintain Correct ph to goal 7.20 if able, doubtful  RENAL A: Worsening lactic acidosis 11/19, ARF, ATN likely P Brain edema, limit gross pos balance Limit d5 in fluids Limit any  free water Saline bmet q8h HD would not change outcome and would be futile Doubtful to even tolerate cvvhd, will follow output and chem  GI A: Ileus new since 04/24/16 folloing failed challenge with lactulose  P NG Tube to LIS CHeck lactate ABd port 04/26/16 Get ldh NO feed lft repeat  ID A: ? Sepsis 11/19 , PNA asp? 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 Tyson Alias, MD, FACP Pgr: [phone] [facility]   I have had extensive discussions with family dad. We discussed patients current circumstances and organ failures. We also discussed patient's prior wishes under circumstances such as this. Family has decided to NOT perform resuscitation if arrest but to continue current medical support for now. Dad on way on   Now ccm time 90 min

## 2016-04-26 NOTE — Progress Notes (Signed)
eLink Physician-Brief Progress Note Patient Name: Ryan [name] DOB: 11/25/1970 MRN: [id number]   Date of Service  04/26/2016  HPI/Events of Note  Discussed dosing of versed with neurology   eICU Interventions  Will give 0.2mg /kg IV push then 0.2mg /kg/hr afterwards        [name] 04/26/2016, 4:51 AM

## 2016-04-26 NOTE — Progress Notes (Signed)
eLink Physician-Brief Progress Note Patient Name: Ryan [name] DOB: 09/14/1970 MRN: [id number]   Date of Service  04/26/2016  HPI/Events of Note  Seizures persist Discussed with neurology   eICU Interventions  Plan CT head now Versed infusion     Intervention Category Major Interventions: Seizures - evaluation and management  Max FickleDouglas Odessa Nishi 04/26/2016, 4:42 AM

## 2016-04-26 NOTE — Procedures (Signed)
ELECTROENCEPHALOGRAM REPORT  Date of Study: 04/26/2016  Patient's Name: Ryan FontJoseph Harr MRN: [id number] Date of Birth: 12-21-1970  Referring Provider: Dr. Max Fickleouglas McQuaid  Clinical History: This is a 45 year old man with new onset seizure.  Medications: levETIRAcetam (KEPPRA) 1,000 mg in sodium chloride 0.9 % 100 mL IVPB  fentaNYL 2500mcg in NS 250mL (4310mcg/ml) infusion-PREMIX  midazolam (VERSED) 100 mg in sodium chloride 0.9 % 100 mL (1 mg/mL) infusion  cholecalciferol (VITAMIN D) tablet 1,000 Units  cisatracurium (NIMBEX) 200 mg in sodium chloride 0.9 % 200 mL (1 mg/mL) infusion  folic acid (FOLVITE) tablet 1 mg  hydrocortisone sodium succinate (SOLU-CORTEF) 100 MG injection 50 mg  lactulose (CHRONULAC) 10 GM/15ML solution 30 g  norepinephrine (LEVOPHED) 16 mg in dextrose 5 % 250 mL (0.064 mg/mL) infusion  pantoprazole (PROTONIX) injection 40 mg  piperacillin-tazobactam (ZOSYN) IVPB 3.375 g  rifaximin (XIFAXAN) tablet 550 mg  thiamine (VITAMIN B-1) tablet 100 mg  vasopressin (PITRESSIN) 40 Units in sodium chloride 0.9 % 250 mL (0.16 Units/mL) infusion   Technical Summary: A multichannel digital EEG recording measured by the international 10-20 system with electrodes applied with paste and impedances below 5000 ohms performed as portable with EKG monitoring in an intubated and sedated patient.  Hyperventilation and photic stimulation were not performed.  The digital EEG was referentially recorded, reformatted, and digitally filtered in a variety of bipolar and referential montages for optimal display.   Description: The patient is intubated and sedated during the recording. There is no clear posterior dominant rhythm. The background consists of diffuse theta and delta slowing, with vertex waves and sharply contoured sleep spindles seen over the parasagittal derivations. Patient on Nimbex, noxious stimulation was not performed. Hyperventilation and photic stimulation were not performed   There were no clear epileptiform discharges or electrographic seizures seen.    EKG lead showed sinus tachycardia.  Impression: This sedated EEG is abnormal due to moderate diffuse slowing of the background.  Clinical Correlation of the above findings indicates diffuse cerebral dysfunction that is non-specific in etiology and can be seen with hypoxic/ischemic injury, toxic/metabolic encephalopathies, or medication effect from Versed and Fentanyl. There were no electrographic seizures seen in this study.    Patrcia DollyKaren Aquino, M.D.

## 2016-04-26 NOTE — Progress Notes (Signed)
Patient transported form 4N22 to CT and back without any complications.

## 2016-04-26 NOTE — Progress Notes (Signed)
Worsening shock, Now on levophed and vasopressin Femoral A line placed CVP-8.  Witnessed seizures > given IV ativan and keppra.  Blood pressure 110/62, pulse (!) 127, temperature 98.9 F (37.2 C), temperature source Core (Comment), resp. rate (!) 26, height 5' 9.5" (1.765 m), weight 210 lb 1.6 oz (95.3 kg), SpO2 91 %. Acutely ill, sedated, unresponsive CVS-RRR RS- Rhonchi Abd- Distended, + BS Ext- No edema  Labs and imaging reviewed. 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 CT head to assess for cerebral edema. - Benzos and keppra for seizures. - Sodium bicarb drio - Start stress dose steroids - Give 1 more lt NS - Continue broad spectrum abx and pressors.  His father Gabriel RungJoe was called and updated about the clinical status. The prognosis looks increasingly grim. He will come in at 9 today Am to meet with the dayteam. Critical care time- 35 mins.  Chilton GreathousePraveen Luccia Reinheimer MD [facility] Pager [phone] If no answer or after 3pm call: [phone] 04/26/2016, 4:28 AM

## 2016-04-26 NOTE — Progress Notes (Signed)
Patient actively seizing. McQuaid,MD camaered in the room. A total of 12mg  of Ativan was given per MD orders. Orders to start Versed gtt. Once seizures have subsided, patient is to go to CT.

## 2016-04-26 NOTE — Progress Notes (Signed)
[id number]: Patient continuing to seize. Patient hypoxic.McQuaid camaered in the room. PEEP changed to 14.   0623: Patient continues to be hypoxic. PEEP changed to 16 per McQuaid.

## 2016-04-26 NOTE — Progress Notes (Signed)
Orders to give 95mg  Rocuronium per Kendrick FriesMcQuaid, MD.

## 2016-04-26 NOTE — Progress Notes (Signed)
Per visitors and daughter, patient has had seizure activity in the past.

## 2016-04-27 ENCOUNTER — Inpatient Hospital Stay (HOSPITAL_COMMUNITY): Payer: BLUE CROSS/BLUE SHIELD

## 2016-04-27 LAB — MYCOPLASMA PNEUMONIAE ANTIBODY, IGM

## 2016-04-27 LAB — CULTURE, BLOOD (ROUTINE X 2)

## 2016-04-28 ENCOUNTER — Telehealth: Payer: Self-pay

## 2016-04-28 LAB — METHYLMALONIC ACID, SERUM: Methylmalonic Acid, Quantitative: 276 nmol/L (ref 0–378)

## 2016-04-28 NOTE — Telephone Encounter (Signed)
On 04/28/2016 I received a death certificate from [facility] [employer] (faxed). The death certificate is for cremation. The patient is a patient of Doctor Tyson AliasFeinstein. [facility].  On 04/28/2016 I received the death certificate back from Doctor Tyson AliasFeinstein. I got the death certificate ready and faxed the death certificate to the funeral home per the funeral home request.

## 2016-04-29 LAB — CULTURE, BLOOD (ROUTINE X 2): CULTURE: NO GROWTH

## 2016-05-01 LAB — CULTURE, BLOOD (ROUTINE X 2)
CULTURE: NO GROWTH
Culture: NO GROWTH

## 2016-05-05 ENCOUNTER — Telehealth: Payer: Self-pay

## 2016-05-05 NOTE — Telephone Encounter (Signed)
On 05/05/2016 I received a death certificate from [facility] (original). The death certificate is for cremation. The patient is a patient of Doctor Tyson AliasFeinstein. [facility] this am for signature.  On 05/05/2016 I received the death certificate back from Doctor Tyson AliasFeinstein. I got the death certificate ready and called the funeral home to let them know the [facility] per the [facility] request.

## 2016-05-07 NOTE — Progress Notes (Signed)
30mL of Versed,and 200mL of Nimbex wasted down sink with Hillery Jacksebecca Schember, RN.

## 2016-05-07 NOTE — Progress Notes (Signed)
eLink Physician-Brief Progress Note Patient Name: Ryan [name] DOB: 5Maggie Font/01/1971 MRN: [id number]   Date of Service  04/13/2016  HPI/Events of Note  Informed patient's father of his son's death  eICU Interventions  Patient's family requests to hold off on another phone call until after 7 AM, they plan to return to the hospital around 10AM     Intervention Category Minor Interventions: Communication with other healthcare providers and/or family  Max FickleDouglas Jameer Storie 04/19/2016, 1:09 AM

## 2016-05-07 NOTE — Progress Notes (Signed)
Patient time of death [id number]. Owens LofflerKaziah Miller, RN and Hillery Jacksebecca Schember, RN listened to breath sounds and heart sounds for a full one minute. No sounds heard. Kendrick FriesMcQuaid, MD called and notified. Family called and updated and family requests not to be contacted until after 7AM and they will return to the hospital around 10AM. All medications turned off. RRT called and notified. Ventilator turned off. CDS Jonetta Osgood(Jamie [address]) and house coverage notified of patient death and families request to hold all calls.

## 2016-05-07 DEATH — deceased

## 2016-06-07 NOTE — Discharge Summary (Signed)
[name], Ryan                ACCOUNT NO.:  [id number]  MEDICAL RECORD NO.:  [id number]  LOCATION:  [id number]                       FACILITY:  [facility]  PHYSICIAN:  Nelda Bucksaniel J Gurshaan Matsuoka, MD DATE OF BIRTH:  05/31/1971  DATE OF ADMISSION:  04/12/2016 DATE OF DISCHARGE:                              DISCHARGE SUMMARY   DEATH SUMMARY  This is an unfortunate 46 year old white male with a recent diagnosis and extensive workup for subacute progressive axonal sensorimotor polyneuropathy and additional history of anemia, encephalopathy, also chronic alcohol abuse, also had hepatic encephalopathy in the past, seen by PCCM after deterioration with increasing oxygen requirements during his hospitalization, being managed by Internal Medicine Chief Service. Had the interval development of ileus, worsening mental status, worsening renal failure, hypotension, multiorgan dysfunction syndrome requiring intubation, central line placement, and had refractory shock requiring escalating doses of maximal pressors, ARDS, presumed pneumonia with acidosis, acquired paralysis, extensive sedation, all in a very short period of time.  However, circulatory shock related to septic shock with high dose pressor requirements in addition to acute renal failure.  Concern was Staph aureus bacteremia noted with endocarditis and multiorgan failure from this.  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  Father was contacted to come in and the patient expired.  FINAL DIAGNOSIS UPON DEATH: 1. Staphylococcus aureus bacteremia, to rule out endocarditis. 2. Aspiration/hospital-acquired pneumonia. 3. Acute respiratory failure. 4. Acute renal failure. 5. Acute respiratory distress syndrome. 6. Refractory shock. 7. Acute tubular necrosis. 8. Alcoholism. 9. Sensorimotor polyneuropathy.     Nelda Bucksaniel J Lillyana Majette, MD     DJF/MEDQ  D:  05/25/2016  T:  05/26/2016  Job:  [id number]

## 2017-12-27 IMAGING — CR DG CHEST 1V PORT
1 series · 1 of 1 positions shown · non-contrast
Comparison: None.

CLINICAL DATA: Increase weakness over several months.

EXAM:
PORTABLE CHEST 1 VIEW

[AP]
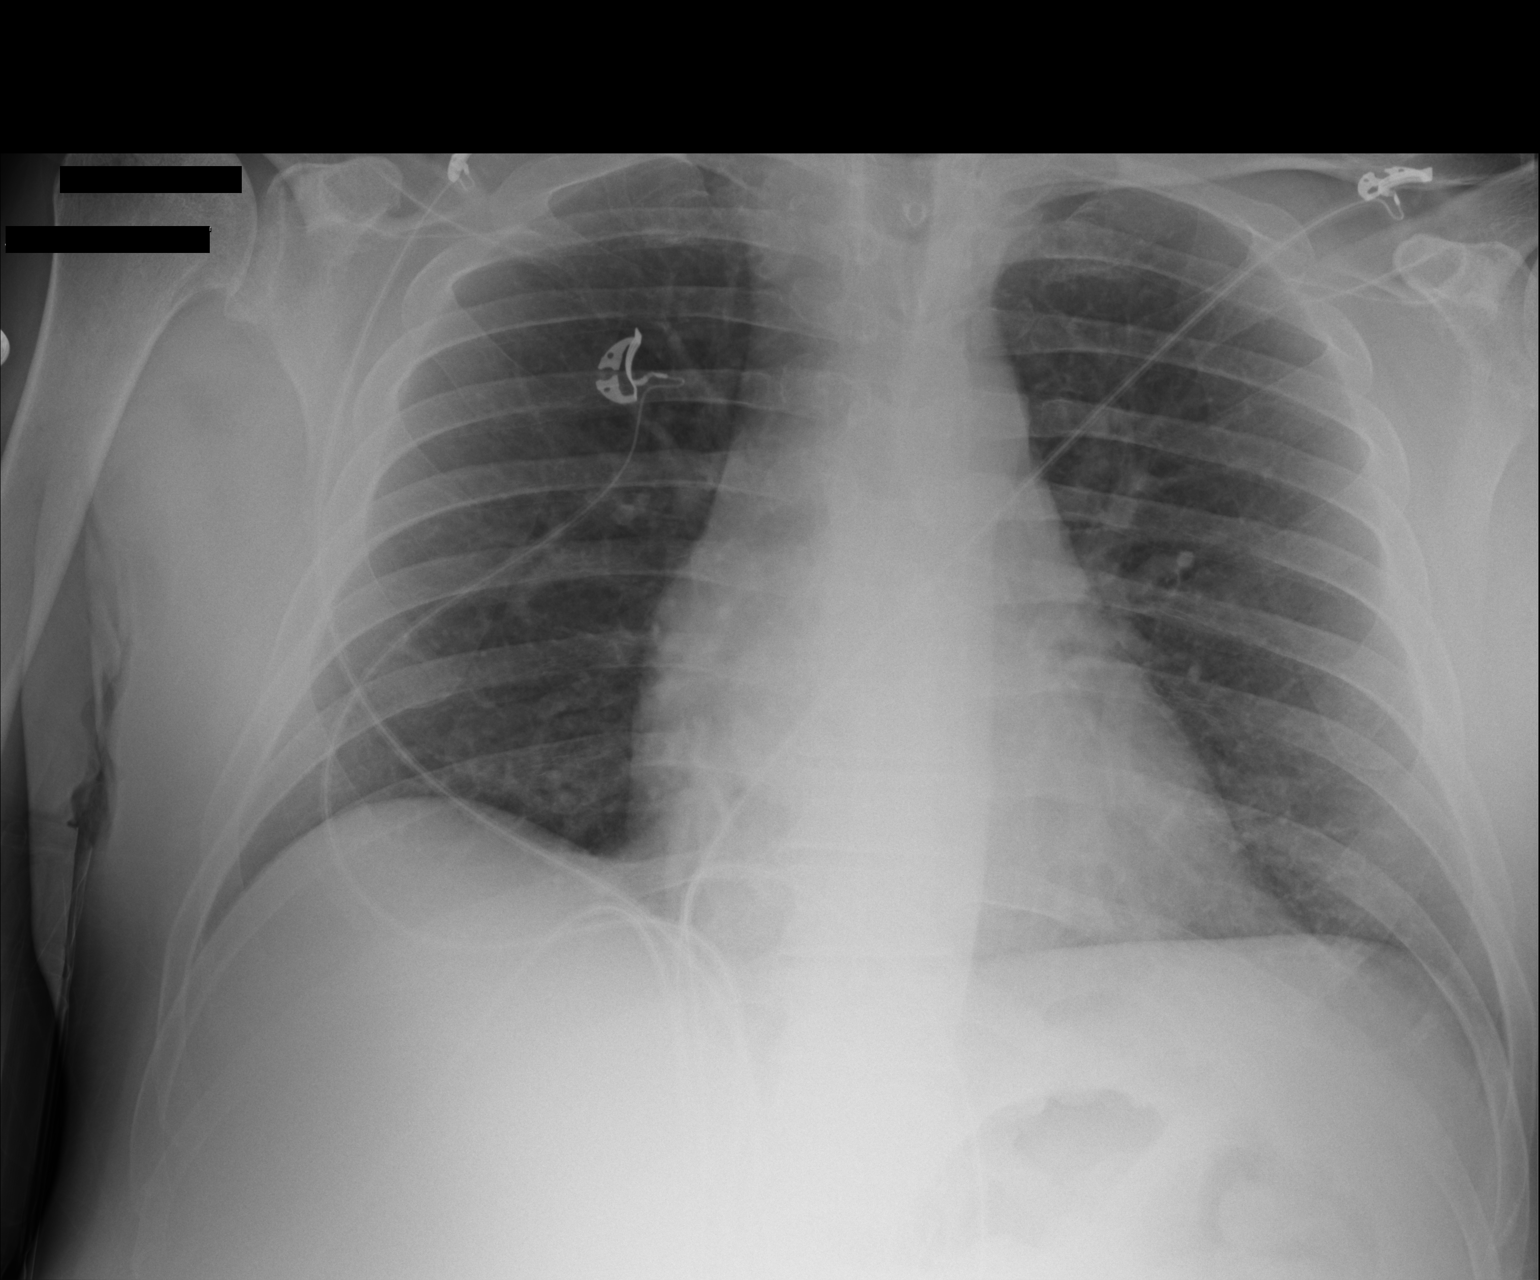

[1 of 1 positions shown; findings below may reference images not displayed]

FINDINGS: The lungs are clear wiithout focal pneumonia, edema, pneumothorax or
pleural effusion. Cardiopericardial silhouette is at upper limits of
normal for size. The visualized bony structures of the thorax are
intact. Telemetry leads overlie the chest.
IMPRESSION: No acute cardiopulmonary findings.

## 2017-12-28 IMAGING — CR DG CHEST 1V PORT
1 series · 1 of 1 positions shown · non-contrast
Comparison: PA and lateral chest 04/23/2016. Single-view of the
chest 04/23/2015.

CLINICAL DATA: Respiratory distress.

EXAM:
PORTABLE CHEST 1 VIEW

[AP]
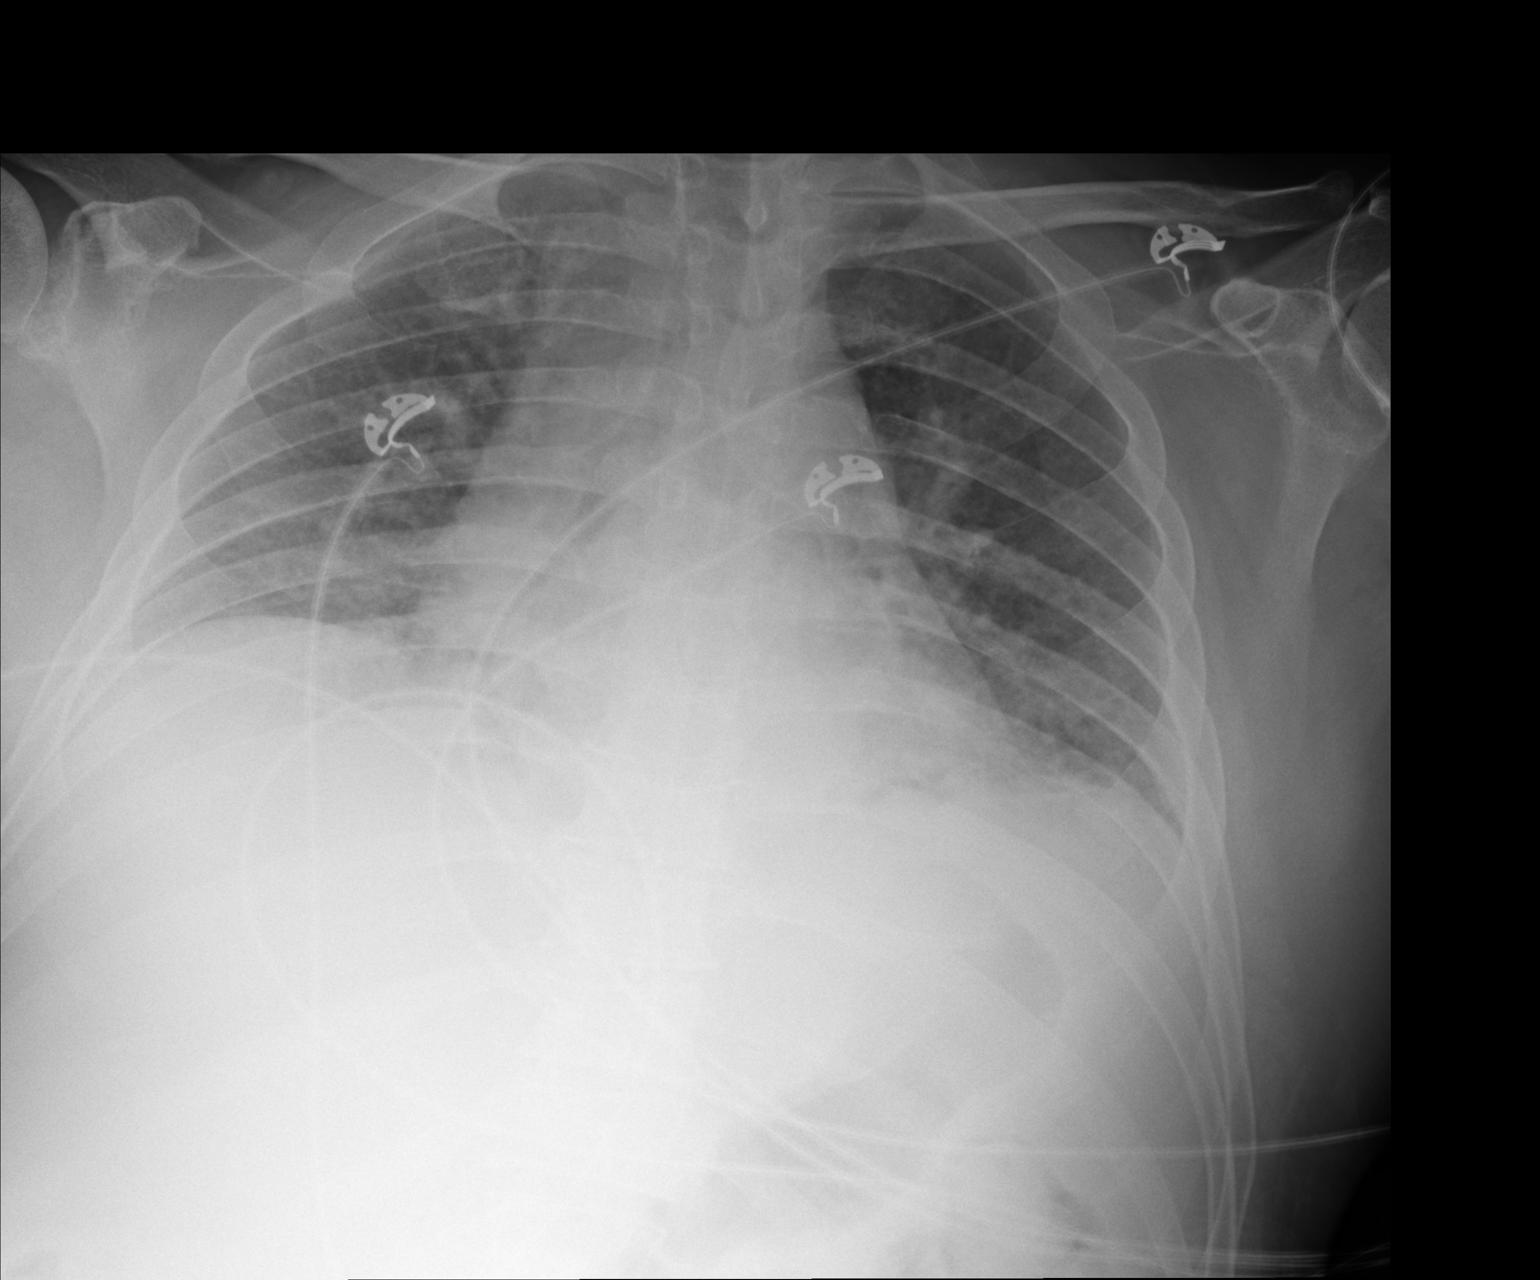

[1 of 1 positions shown; findings below may reference images not displayed]

FINDINGS: Lung volumes are lower than on the comparison studies. There is
cardiomegaly and mild interstitial edema. Subsegmental atelectasis
in the bases is noted. No pneumothorax or effusion.
IMPRESSION: Cardiomegaly and mild interstitial edema. Bibasilar atelectasis in a
low volume chest.

## 2017-12-29 IMAGING — CT CT HEAD W/O CM
4 of 7 series · 18 of 47 positions shown, 19 images · non-contrast
Comparison: MRI 04/08/2016

CLINICAL DATA: Altered mental status

EXAM:
CT HEAD WITHOUT CONTRAST
TECHNIQUE: Contiguous axial images were obtained from the base of the skull
through the vertex without intravenous contrast.

[Series 2: head bone · axial · 0.49mm/px · z∈[-83,+67]mm · 8 of 87 slices shown]
[im 6/87  bone]
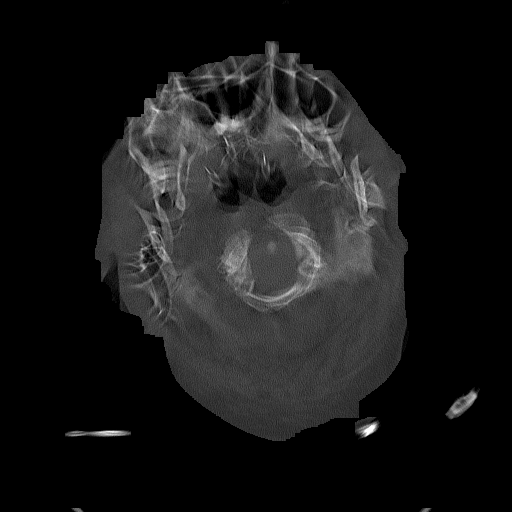
[im 17/87  bone]
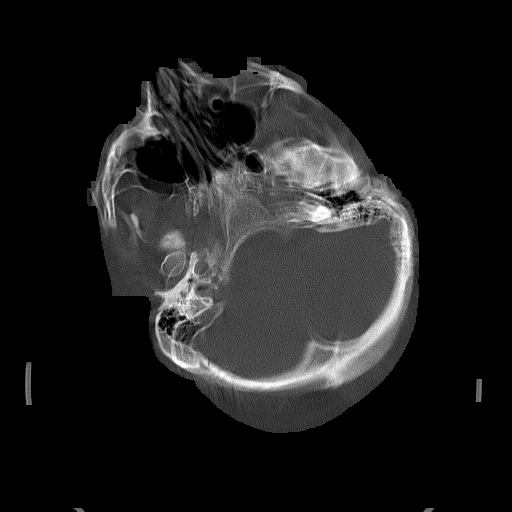
[im 27/87  bone]
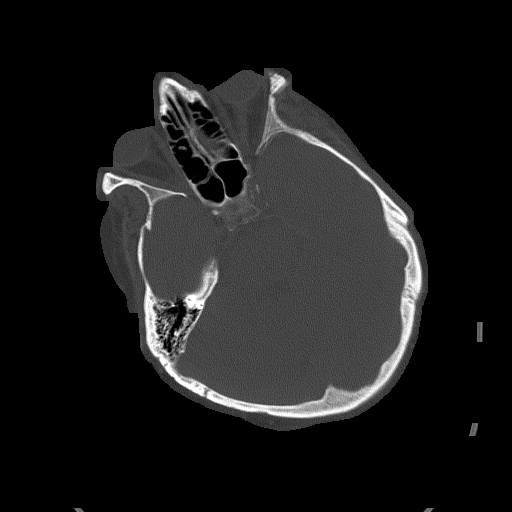
[im 38/87  bone]
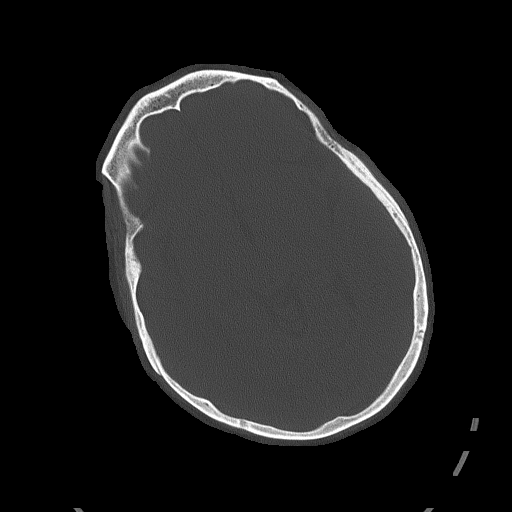
[im 49/87  bone]
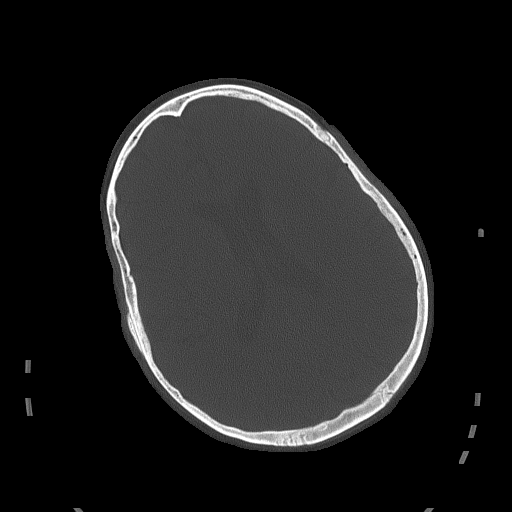
[im 60/87  bone]
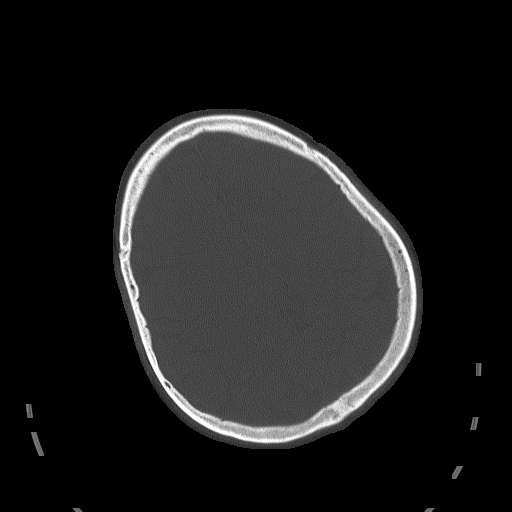
[im 70/87  bone]
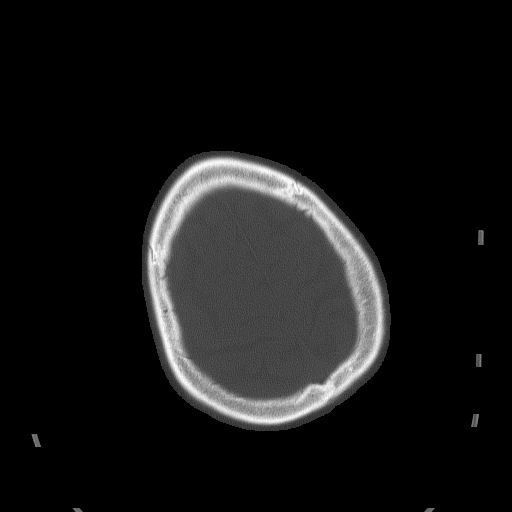
[im 81/87  bone]
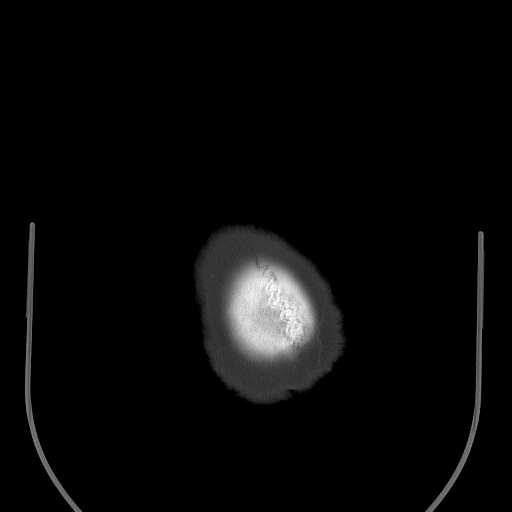

[Series 4: head without · axial · non-contrast · 0.49mm/px · z∈[-63,+42]mm · 4 of 35 slices shown, 5 images]
[im 7/35  brain]
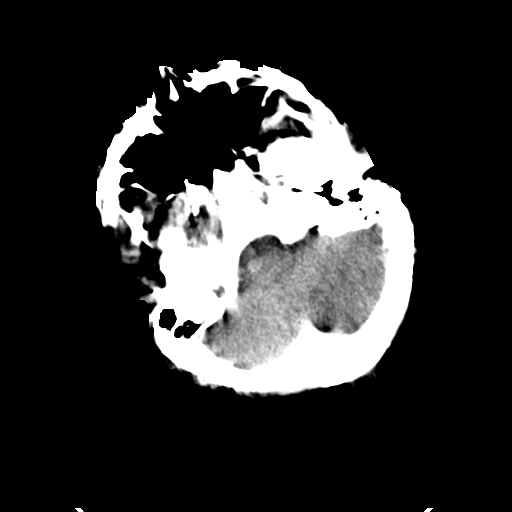
[im 7/35  bone]
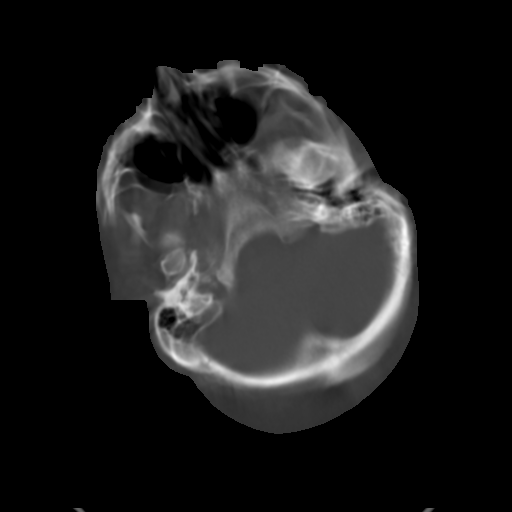
[im 14/35  brain]
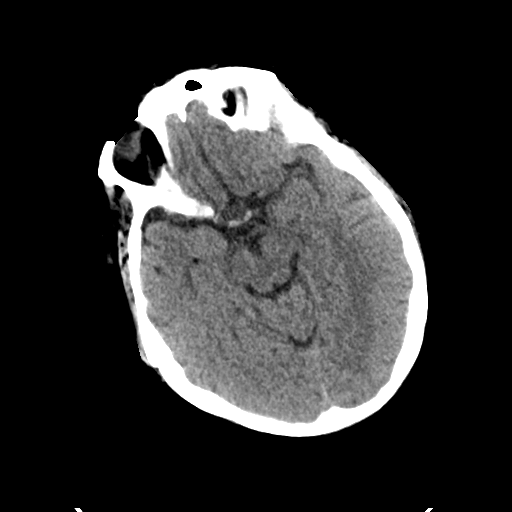
[im 21/35  brain]
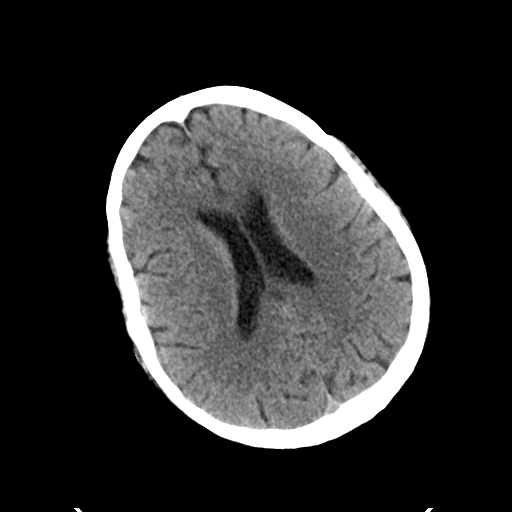
[im 28/35  brain]
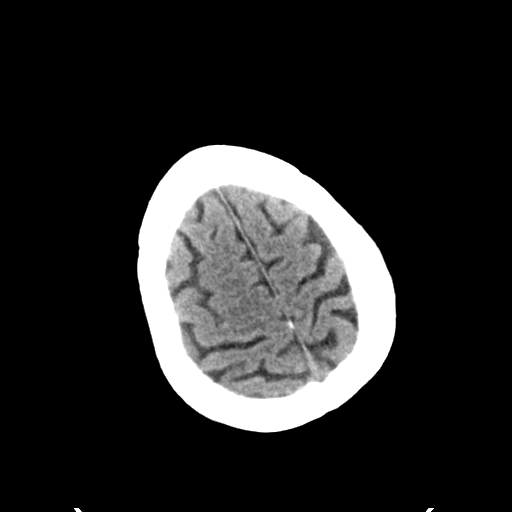

[Series 6: head without cor · coronal · non-contrast · 0.34mm/px · 3 of 71 slices shown]
[im 9/71  brain]
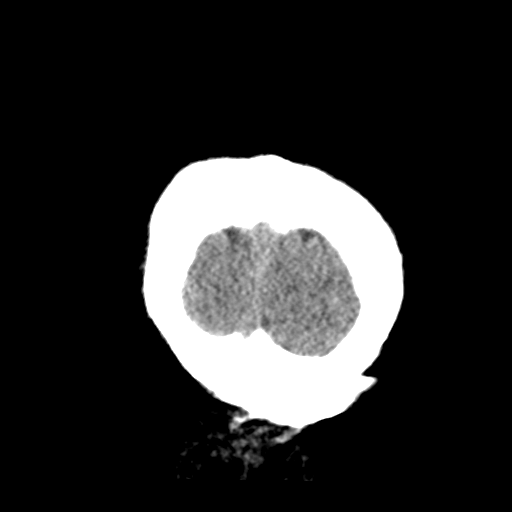
[im 18/71  brain]
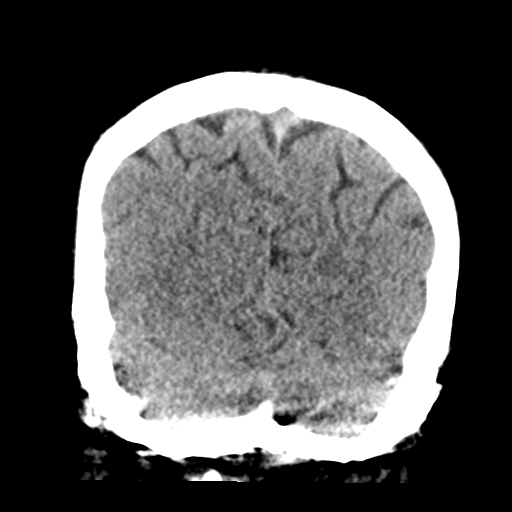
[im 27/71  brain]
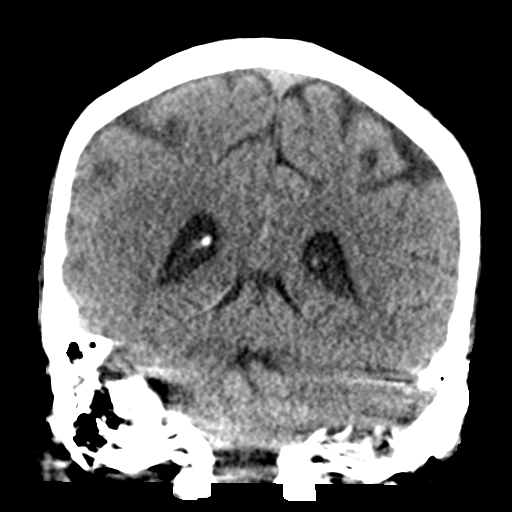

[Series 7: head without sag · sagittal · non-contrast · 0.34mm/px · 3 of 57 slices shown]
[im 19/57  brain]
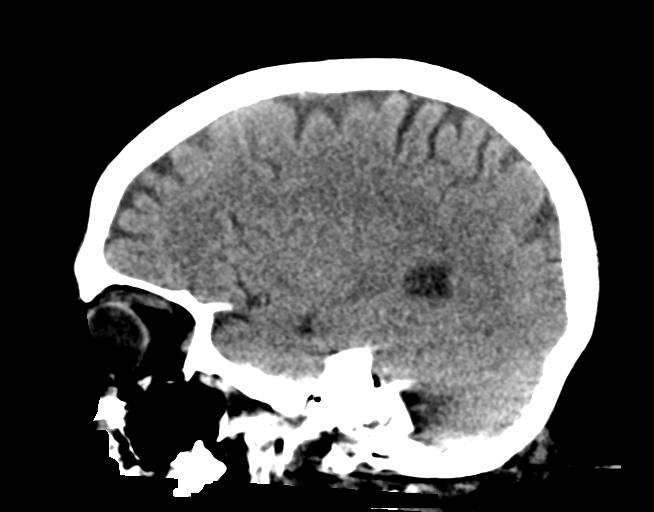
[im 29/57  brain]
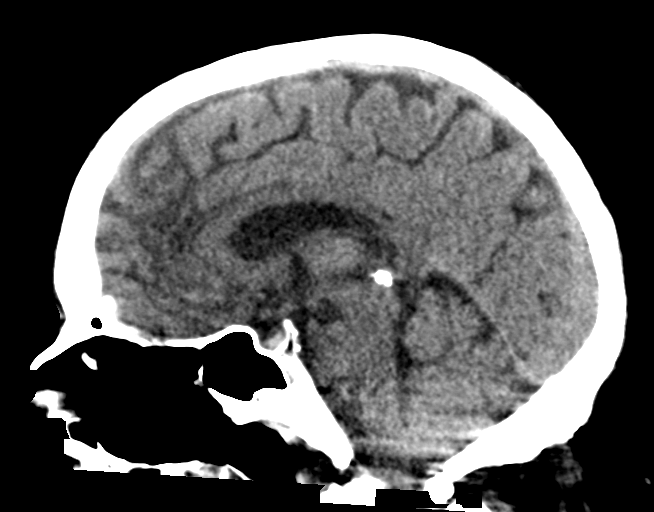
[im 38/57  brain]
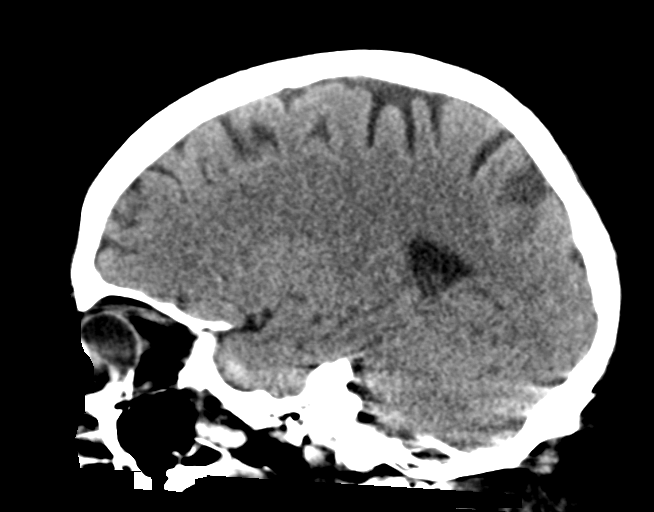

[18 of 47 positions shown; findings below may reference images not displayed]

FINDINGS: Brain: No acute intracranial abnormality. Specifically, no
hemorrhage, hydrocephalus, mass lesion, acute infarction, or
significant intracranial injury.

Vascular: No hyperdense vessel or unexpected calcification.

Skull: No acute calvarial abnormality

Sinuses/Orbits: Visualized paranasal sinuses and mastoids clear.
Orbital soft tissues unremarkable.

Other: None
IMPRESSION: No acute intracranial abnormality.
